# Patient Record
Sex: Female | Born: 1960 | Hispanic: No | Marital: Married | Smoking: Never smoker
Health system: Southern US, Community
[De-identification: ages and names within clinical notes are randomized; demographics above are authoritative.]

## PROBLEM LIST (undated history)

## (undated) DIAGNOSIS — F329 Major depressive disorder, single episode, unspecified: Secondary | ICD-10-CM

## (undated) DIAGNOSIS — D649 Anemia, unspecified: Secondary | ICD-10-CM

## (undated) DIAGNOSIS — T7840XA Allergy, unspecified, initial encounter: Secondary | ICD-10-CM

## (undated) DIAGNOSIS — I10 Essential (primary) hypertension: Secondary | ICD-10-CM

## (undated) DIAGNOSIS — R011 Cardiac murmur, unspecified: Secondary | ICD-10-CM

## (undated) DIAGNOSIS — F4310 Post-traumatic stress disorder, unspecified: Secondary | ICD-10-CM

## (undated) DIAGNOSIS — E7800 Pure hypercholesterolemia, unspecified: Secondary | ICD-10-CM

## (undated) DIAGNOSIS — F419 Anxiety disorder, unspecified: Secondary | ICD-10-CM

## (undated) DIAGNOSIS — R7303 Prediabetes: Secondary | ICD-10-CM

## (undated) DIAGNOSIS — M1990 Unspecified osteoarthritis, unspecified site: Secondary | ICD-10-CM

## (undated) DIAGNOSIS — M797 Fibromyalgia: Secondary | ICD-10-CM

## (undated) DIAGNOSIS — G9332 Myalgic encephalomyelitis/chronic fatigue syndrome: Secondary | ICD-10-CM

## (undated) DIAGNOSIS — G629 Polyneuropathy, unspecified: Secondary | ICD-10-CM

## (undated) DIAGNOSIS — Z8619 Personal history of other infectious and parasitic diseases: Secondary | ICD-10-CM

## (undated) DIAGNOSIS — G2581 Restless legs syndrome: Secondary | ICD-10-CM

## (undated) DIAGNOSIS — R5382 Chronic fatigue, unspecified: Secondary | ICD-10-CM

## (undated) DIAGNOSIS — F32A Depression, unspecified: Secondary | ICD-10-CM

## (undated) HISTORY — DX: Myalgic encephalomyelitis/chronic fatigue syndrome: G93.32

## (undated) HISTORY — PX: APPENDECTOMY: SHX54

## (undated) HISTORY — DX: Pure hypercholesterolemia, unspecified: E78.00

## (undated) HISTORY — PX: DILATION AND CURETTAGE OF UTERUS: SHX78

## (undated) HISTORY — DX: Major depressive disorder, single episode, unspecified: F32.9

## (undated) HISTORY — PX: ROTATOR CUFF REPAIR: SHX139

## (undated) HISTORY — DX: Fibromyalgia: M79.7

## (undated) HISTORY — DX: Essential (primary) hypertension: I10

## (undated) HISTORY — DX: Polyneuropathy, unspecified: G62.9

## (undated) HISTORY — DX: Chronic fatigue, unspecified: R53.82

## (undated) HISTORY — DX: Depression, unspecified: F32.A

## (undated) HISTORY — DX: Allergy, unspecified, initial encounter: T78.40XA

## (undated) HISTORY — DX: Unspecified osteoarthritis, unspecified site: M19.90

## (undated) HISTORY — DX: Anxiety disorder, unspecified: F41.9

## (undated) HISTORY — DX: Prediabetes: R73.03

## (undated) HISTORY — DX: Personal history of other infectious and parasitic diseases: Z86.19

## (undated) HISTORY — DX: Post-traumatic stress disorder, unspecified: F43.10

## (undated) HISTORY — DX: Anemia, unspecified: D64.9

## (undated) HISTORY — DX: Restless legs syndrome: G25.81

## (undated) HISTORY — DX: Cardiac murmur, unspecified: R01.1

---

## 2012-07-06 HISTORY — PX: CERVICAL DISC SURGERY: SHX588

## 2012-07-06 HISTORY — PX: ABDOMINAL HYSTERECTOMY: SHX81

## 2015-01-17 ENCOUNTER — Ambulatory Visit (INDEPENDENT_AMBULATORY_CARE_PROVIDER_SITE_OTHER): Payer: 59 | Admitting: Family Medicine

## 2015-01-17 ENCOUNTER — Encounter: Payer: Self-pay | Admitting: Family Medicine

## 2015-01-17 ENCOUNTER — Encounter (INDEPENDENT_AMBULATORY_CARE_PROVIDER_SITE_OTHER): Payer: Self-pay

## 2015-01-17 VITALS — BP 126/88 | HR 75 | Temp 98.3°F | Resp 16 | Ht 63.0 in | Wt 193.2 lb

## 2015-01-17 DIAGNOSIS — M4322 Fusion of spine, cervical region: Secondary | ICD-10-CM | POA: Insufficient documentation

## 2015-01-17 DIAGNOSIS — F3341 Major depressive disorder, recurrent, in partial remission: Secondary | ICD-10-CM | POA: Insufficient documentation

## 2015-01-17 DIAGNOSIS — M7581 Other shoulder lesions, right shoulder: Secondary | ICD-10-CM | POA: Insufficient documentation

## 2015-01-17 DIAGNOSIS — I10 Essential (primary) hypertension: Secondary | ICD-10-CM

## 2015-01-17 DIAGNOSIS — G2581 Restless legs syndrome: Secondary | ICD-10-CM | POA: Insufficient documentation

## 2015-01-17 DIAGNOSIS — G8929 Other chronic pain: Secondary | ICD-10-CM | POA: Insufficient documentation

## 2015-01-17 DIAGNOSIS — R52 Pain, unspecified: Secondary | ICD-10-CM | POA: Diagnosis not present

## 2015-01-17 DIAGNOSIS — M797 Fibromyalgia: Secondary | ICD-10-CM | POA: Diagnosis not present

## 2015-01-17 DIAGNOSIS — Z13220 Encounter for screening for lipoid disorders: Secondary | ICD-10-CM

## 2015-01-17 DIAGNOSIS — F418 Other specified anxiety disorders: Secondary | ICD-10-CM

## 2015-01-17 DIAGNOSIS — G4701 Insomnia due to medical condition: Secondary | ICD-10-CM | POA: Insufficient documentation

## 2015-01-17 DIAGNOSIS — R5383 Other fatigue: Secondary | ICD-10-CM | POA: Insufficient documentation

## 2015-01-17 DIAGNOSIS — B0229 Other postherpetic nervous system involvement: Secondary | ICD-10-CM | POA: Insufficient documentation

## 2015-01-17 HISTORY — DX: Essential (primary) hypertension: I10

## 2015-01-17 MED ORDER — MILNACIPRAN HCL 12.5 MG PO TABS: 1.0000 | ORAL_TABLET | Freq: Two times a day (BID) | ORAL | Status: DC

## 2015-01-17 NOTE — Progress Notes (Signed)
Name: Yvette Garza   MRN: [id number]    DOB: 27-Jul-1960   Date:01/17/2015       Progress Note  Subjective  Chief Complaint  Chief Complaint  Patient presents with  . Establish Care    HPI  Joint/Muscle Pain: Patient complains of arthralgias and myalgias for which has been present for several years. Pain is located in neck, shoulders, arms, is described as aching, and is constant .  Associated symptoms include: decreased range of motion.  The patient has tried cold, heat, NSAIDs, position change, rest and gabapentin for pain, with minimal relief.  Long standing issue with Cervical Spine pain with recent anterior approach fusion surgery done Oct-Nov 2014 in [place] where she is relocating from today. She is concerned about ongoing restrictions in ROM of her neck and ongoing pain and weakness in her hands. Overall her fibromyalgia is not well controled. Depression well controled and insomnia well controled on current medication.  Hypertension: Patient is here for evaluation of elevated blood pressures.  Age at onset of elevated blood pressure:  Several years ago. Cardiac symptoms fatigue. Patient denies chest pain, chest pressure/discomfort, claudication, exertional chest pressure/discomfort, irregular heart beat, lower extremity edema, near-syncope, palpitations, syncope and tachypnea.  Cardiovascular risk factors: hypertension, obesity (BMI >= 30 kg/m2) and sedentary lifestyle. Use of agents associated with hypertension: NSAIDS. History of target organ damage: none.    Patient Active Problem List   Diagnosis Date Noted  . Fibromyalgia 01/17/2015  . Depression with anxiety 01/17/2015  . Insomnia due to medical condition 01/17/2015  . History of hypertension 01/17/2015  . Chronic pain of multiple sites 01/17/2015    History  Substance Use Topics  . Smoking status: Never Smoker   . Smokeless tobacco: Not on file  . Alcohol Use: 0.0 oz/week    0 Standard drinks or equivalent  per week     Comment: socially     Current outpatient prescriptions:  .  buPROPion (WELLBUTRIN XL) 300 MG 24 hr tablet, TK 1 T PO QD, Disp: , Rfl: 0 .  lisinopril (PRINIVIL,ZESTRIL) 5 MG tablet, Take 5 mg by mouth daily., Disp: , Rfl:  .  naproxen (NAPROSYN) 500 MG tablet, Take 500 mg by mouth 2 (two) times daily with a meal., Disp: , Rfl:  .  traZODone (DESYREL) 100 MG tablet, TK 1 T PO HS, Disp: , Rfl: 1  Past Surgical History  Procedure Laterality Date  . Abdominal hysterectomy  2014  . Cervical disc surgery  2014  . Appendectomy      age 24  . Dilation and curettage of uterus    . Cesarean section      3  . Rotator cuff repair Right     bone shaved also    Family History  Problem Relation Age of Onset  . Hypertension Mother   . Asthma Sister   . Emphysema Sister   . Diabetes Maternal Aunt   . Hyperlipidemia Maternal Aunt   . Arthritis Maternal Grandmother   . Diabetes Maternal Grandmother   . Cataracts Maternal Grandmother   . Heart disease Maternal Grandmother     aortic tear  . Stroke Maternal Grandmother     No Known Allergies   Review of Systems  CONSTITUTIONAL: No significant weight changes, fever, chills, weakness or fatigue.  HEENT:  - Eyes: No visual changes.  - Ears: No auditory changes. No pain.  - Nose: No sneezing, congestion, runny nose. - Throat: No sore throat. No changes in swallowing.  SKIN: No rash or itching.  CARDIOVASCULAR: No chest pain, chest pressure or chest discomfort. No palpitations or edema.  RESPIRATORY: No shortness of breath, cough or sputum.  GASTROINTESTINAL: No anorexia, nausea, vomiting. No changes in bowel habits. No abdominal pain or blood.  GENITOURINARY: No dysuria. No frequency. No discharge.  NEUROLOGICAL: No headache, dizziness, syncope, paralysis, ataxia, numbness or tingling in the extremities. No memory changes. No change in bowel or bladder control.  MUSCULOSKELETAL: Chronic joint and muscle pain. HEMATOLOGIC:  No anemia, bleeding or bruising.  LYMPHATICS: No enlarged lymph nodes.  PSYCHIATRIC: No change in mood. No change in sleep pattern.  ENDOCRINOLOGIC: No reports of sweating, cold or heat intolerance. No polyuria or polydipsia.    Objective  BP 126/88 mmHg  Pulse 75  Temp(Src) 98.3 F (36.8 C) (Oral)  Resp 16  Ht 5\' 3"  (1.6 m)  Wt 193 lb 3.2 oz (87.635 kg)  BMI 34.23 kg/m2  SpO2 98% Body mass index is 34.23 kg/(m^2).  Physical Exam  Constitutional: Patient is obese and well-nourished. In no distress.  HEENT:  - Head: Normocephalic and atraumatic.  - Ears: Bilateral TMs gray, no erythema or effusion - Nose: Nasal mucosa moist - Mouth/Throat: Oropharynx is clear and moist. No tonsillar hypertrophy or erythema. No post nasal drainage.  - Eyes: Conjunctivae clear, EOM movements normal. PERRLA. No scleral icterus.  Neck: Normal range of motion. Neck supple. No JVD present. No thyromegaly present.  Cardiovascular: Normal rate, regular rhythm and normal heart sounds.  No murmur heard.  Pulmonary/Chest: Effort normal and breath sounds normal. No respiratory distress. Musculoskeletal: C-spine no palpable step off or point tenderness, ROM restricted side to side movement about 50degrees, pain worse with chin to chest. Bilateral UE strength 4/5 at shoulder joints and hand grip. Sensation intact.  Peripheral vascular: Bilateral LE no edema. Neurological: CN II-XII grossly intact with no focal deficits. Alert and oriented to person, place, and time. Coordination, balance, strength, speech and gait are normal.  Skin: Skin is warm and dry. No rash noted. No erythema.  Psychiatric: Patient has a normal mood and affect. Behavior is normal in office today. Judgment and thought content normal in office today.   Assessment & Plan   1. Fibromyalgia Trial of [place]ella. If not approved by insurance consider restarting Gabapentin at night time only (otherwise it caused her to be be very drowsy during  the day)  - Milnacipran HCl 12.5 MG TABS; Take 1 tablet (12.5 mg total) by mouth 2 (two) times daily.  Dispense: 60 tablet; Refill: 2  2. Depression with anxiety Stable on Wellbutrin alone since 2005.  3. Insomnia due to medical condition Reasonably controled on Trazodone.   4. Chronic pain of multiple sites Awaiting previous records to review from [place].  - Sedimentation rate  5. Fusion of spine of cervical region October-November of 2014 had C-spine fusion and disc cadaver replacement. Still having restricted ROM side to side movements with some neck pain radiating down trapezius to shoulders and arm, right worse than left. She would like to consult with Neurosurgery group.  - Ambulatory referral to Neurosurgery  6. Hypertension goal BP (blood pressure) < 150/90 Reasonable today off of lisinopril 5mg   - CBC with Differential/Platelet - Comprehensive metabolic panel - TSH - Hemoglobin A1c  7. Screening cholesterol level - Lipid panel

## 2015-01-22 ENCOUNTER — Telehealth: Payer: Self-pay | Admitting: Family Medicine

## 2015-01-22 NOTE — Telephone Encounter (Signed)
Patient states that [facility] told her that Milnacipran is needing authorization. Please call patient once this has been taken care of

## 2015-01-22 NOTE — Telephone Encounter (Signed)
Called patient and notified her that I have sent in her Prior Authorization and will have to wait for the insurance company to approve or deny and then will let her know, pt verbalized understanding.

## 2015-02-05 ENCOUNTER — Telehealth: Payer: Self-pay

## 2015-02-05 NOTE — Telephone Encounter (Signed)
Pt called wanting to know status of medication needing a PA. Her pharmacy told her that they had faxed over needed paperwork for doctor to sign.

## 2015-02-06 ENCOUNTER — Other Ambulatory Visit: Payer: Self-pay | Admitting: Family Medicine

## 2015-02-06 ENCOUNTER — Other Ambulatory Visit: Payer: Self-pay

## 2015-02-06 DIAGNOSIS — F418 Other specified anxiety disorders: Secondary | ICD-10-CM

## 2015-02-06 DIAGNOSIS — M797 Fibromyalgia: Secondary | ICD-10-CM

## 2015-02-06 DIAGNOSIS — G4700 Insomnia, unspecified: Secondary | ICD-10-CM

## 2015-02-06 DIAGNOSIS — B0229 Other postherpetic nervous system involvement: Secondary | ICD-10-CM

## 2015-02-06 MED ORDER — DULOXETINE HCL 30 MG PO CPEP
30.0000 mg | ORAL_CAPSULE | Freq: Every day | ORAL | Status: DC
Start: 2015-02-06 — End: 2015-02-19

## 2015-02-06 MED ORDER — BUPROPION HCL ER (XL) 300 MG PO TB24: 300.0000 mg | ORAL_TABLET | Freq: Every day | ORAL | Status: DC

## 2015-02-06 MED ORDER — TRAZODONE HCL 100 MG PO TABS: 100.0000 mg | ORAL_TABLET | Freq: Every evening | ORAL | Status: DC | PRN

## 2015-02-06 NOTE — Telephone Encounter (Signed)
Please let patient know that Insurance denied coverage for [facility] despite prior authorization completed because they want her to try one of the following: Amitriptyline, duloxetine. I can sent a prescription for one of those if she is agreeable, let me know.

## 2015-02-06 NOTE — Telephone Encounter (Signed)
Tried to contact this patient to review the message below, but there was no answer. A message was left for her explaining that a medication was sent in for her that should cover all of her sx and if she had any questions to give [place] a call.

## 2015-02-06 NOTE — Telephone Encounter (Signed)
Patient stated that she will try whichever one Dr. Nadine Counts thinks will work best. Please send to [place]. [employer].

## 2015-02-06 NOTE — Telephone Encounter (Signed)
Please let patient know that I sent in Cymbalta (generic she will get is duloxetine) 30mg  one a day. It comes in 20mg , 30mg  and 60mg  so I started her in the middle at 30mg . This medication has had good results for fibromyalgia, pain, depression and anxiety so try and stick to it as it may take 1-2 months to be effective and we can adjust dose as needed.

## 2015-02-06 NOTE — Telephone Encounter (Signed)
Received a fax from [facility] requesting a refill for both of these medications.  Refill request was sent to Dr. Bobetta Lime for approval and submission.

## 2015-02-07 ENCOUNTER — Other Ambulatory Visit: Payer: Self-pay | Admitting: Neurology

## 2015-02-07 DIAGNOSIS — R413 Other amnesia: Secondary | ICD-10-CM

## 2015-02-07 DIAGNOSIS — R479 Unspecified speech disturbances: Secondary | ICD-10-CM

## 2015-02-07 DIAGNOSIS — R42 Dizziness and giddiness: Secondary | ICD-10-CM

## 2015-02-07 LAB — CBC WITH DIFFERENTIAL/PLATELET
Basophils Absolute: 0 10*3/uL (ref 0.0–0.2)
Basos: 0 %
EOS (ABSOLUTE): 0.1 10*3/uL (ref 0.0–0.4)
Eos: 2 %
HEMATOCRIT: 35.2 % (ref 34.0–46.6)
HEMOGLOBIN: 11.2 g/dL (ref 11.1–15.9)
IMMATURE GRANULOCYTES: 0 %
Immature Grans (Abs): 0 10*3/uL (ref 0.0–0.1)
LYMPHS: 33 %
Lymphocytes Absolute: 1.5 10*3/uL (ref 0.7–3.1)
MCH: 25.5 pg — ABNORMAL LOW (ref 26.6–33.0)
MCHC: 31.8 g/dL (ref 31.5–35.7)
MCV: 80 fL (ref 79–97)
Monocytes Absolute: 0.3 10*3/uL (ref 0.1–0.9)
Monocytes: 7 %
NEUTROS PCT: 58 %
Neutrophils Absolute: 2.6 10*3/uL (ref 1.4–7.0)
Platelets: 182 10*3/uL (ref 150–379)
RBC: 4.4 x10E6/uL (ref 3.77–5.28)
RDW: 16.9 % — ABNORMAL HIGH (ref 12.3–15.4)
WBC: 4.5 10*3/uL (ref 3.4–10.8)

## 2015-02-07 LAB — COMPREHENSIVE METABOLIC PANEL
A/G RATIO: 1.8 (ref 1.1–2.5)
ALK PHOS: 57 IU/L (ref 39–117)
ALT: 10 IU/L (ref 0–32)
AST: 12 IU/L (ref 0–40)
Albumin: 4.1 g/dL (ref 3.5–5.5)
BUN/Creatinine Ratio: 21 (ref 9–23)
BUN: 17 mg/dL (ref 6–24)
Bilirubin Total: 0.4 mg/dL (ref 0.0–1.2)
CHLORIDE: 103 mmol/L (ref 97–108)
CO2: 24 mmol/L (ref 18–29)
Calcium: 9.2 mg/dL (ref 8.7–10.2)
Creatinine, Ser: 0.81 mg/dL (ref 0.57–1.00)
GFR, EST AFRICAN AMERICAN: 97 mL/min/{1.73_m2} (ref >59)
GFR, EST NON AFRICAN AMERICAN: 84 mL/min/{1.73_m2} (ref >59)
GLOBULIN, TOTAL: 2.3 g/dL (ref 1.5–4.5)
GLUCOSE: 96 mg/dL (ref 65–99)
POTASSIUM: 4.4 mmol/L (ref 3.5–5.2)
Sodium: 142 mmol/L (ref 134–144)
Total Protein: 6.4 g/dL (ref 6.0–8.5)

## 2015-02-07 LAB — LIPID PANEL
CHOLESTEROL TOTAL: 201 mg/dL — AB (ref 100–199)
Chol/HDL Ratio: 4.8 ratio units — ABNORMAL HIGH (ref 0.0–4.4)
HDL: 42 mg/dL (ref >39)
LDL CALC: 115 mg/dL — AB (ref 0–99)
TRIGLYCERIDES: 218 mg/dL — AB (ref 0–149)
VLDL Cholesterol Cal: 44 mg/dL — ABNORMAL HIGH (ref 5–40)

## 2015-02-07 LAB — TSH: TSH: 3.22 u[IU]/mL (ref 0.450–4.500)

## 2015-02-07 LAB — HEMOGLOBIN A1C
ESTIMATED AVERAGE GLUCOSE: 123 mg/dL
HEMOGLOBIN A1C: 5.9 % — AB (ref 4.8–5.6)

## 2015-02-07 LAB — SEDIMENTATION RATE: SED RATE: 7 mm/h (ref 0–40)

## 2015-02-08 NOTE — Progress Notes (Signed)
No anemia, but low normal results with microcytic RBCs. Will review all results during upcoming CPE.

## 2015-02-11 ENCOUNTER — Ambulatory Visit: Payer: Commercial Managed Care - HMO

## 2015-02-12 ENCOUNTER — Encounter: Payer: Self-pay | Admitting: Family Medicine

## 2015-02-12 ENCOUNTER — Ambulatory Visit (INDEPENDENT_AMBULATORY_CARE_PROVIDER_SITE_OTHER): Payer: 59 | Admitting: Family Medicine

## 2015-02-12 VITALS — BP 124/78 | HR 97 | Temp 98.2°F | Resp 18 | Wt 193.4 lb

## 2015-02-12 DIAGNOSIS — G43009 Migraine without aura, not intractable, without status migrainosus: Secondary | ICD-10-CM | POA: Insufficient documentation

## 2015-02-12 DIAGNOSIS — M797 Fibromyalgia: Secondary | ICD-10-CM

## 2015-02-12 DIAGNOSIS — Z1239 Encounter for other screening for malignant neoplasm of breast: Secondary | ICD-10-CM | POA: Insufficient documentation

## 2015-02-12 DIAGNOSIS — Z0000 Encounter for general adult medical examination without abnormal findings: Secondary | ICD-10-CM | POA: Diagnosis not present

## 2015-02-12 DIAGNOSIS — Z124 Encounter for screening for malignant neoplasm of cervix: Secondary | ICD-10-CM

## 2015-02-12 DIAGNOSIS — Z1211 Encounter for screening for malignant neoplasm of colon: Secondary | ICD-10-CM | POA: Diagnosis not present

## 2015-02-12 NOTE — Progress Notes (Signed)
Name: Yvette Garza   MRN: [id number]    DOB: 03-10-1961   Date:02/12/2015       Progress Note  Subjective  Chief Complaint  Chief Complaint  Patient presents with  . Annual Exam    HPI  Patient is here today for a Complete Female Physical Exam:  The patient has has no unusual complaints. Overall feels healthy. Diet is well balanced. In general does not exercise regularly. Sees dentist regularly and addresses vision concerns with ophthalmologist if applicable. In regards to sexual activity the patient is currently sexually active. Currently is not concerned about exposure to any STDs. Would like Pelvic exam as she sometimes have left ovarian pain but no vaginal bleeding or discharge. She also had blood work done recently and would like to review results.  Menstrual history: s/p partial hysterectomy.    Past Medical History  Diagnosis Date  . Allergy   . Anemia   . Anxiety   . Arthritis   . Depression   . Heart murmur   . Hyperlipidemia     history of  . Hypertension     history of  . Post traumatic stress disorder (PTSD)   . History of shingles   . Restless leg syndrome   . Fibromyalgia   . Chronic fatigue syndrome   . Neuropathy     post    Past Surgical History  Procedure Laterality Date  . Abdominal hysterectomy  2014  . Cervical disc surgery  2014  . Appendectomy      age 3  . Dilation and curettage of uterus    . Cesarean section      3  . Rotator cuff repair Right     bone shaved also    Family History  Problem Relation Age of Onset  . Hypertension Mother   . Asthma Sister   . Emphysema Sister   . Diabetes Maternal Aunt   . Hyperlipidemia Maternal Aunt   . Arthritis Maternal Grandmother   . Diabetes Maternal Grandmother   . Cataracts Maternal Grandmother   . Heart disease Maternal Grandmother     aortic tear  . Stroke Maternal Grandmother     History   Social History  . Marital Status: Married    Spouse Name: N/A  . Number of Children: 4   . Years of Education: N/A   Occupational History  . Not on file.   Social History Main Topics  . Smoking status: Never Smoker   . Smokeless tobacco: Not on file  . Alcohol Use: 0.0 oz/week    0 Standard drinks or equivalent per week     Comment: socially  . Drug Use: No  . Sexual Activity:    Partners: Male    Birth Control/ Protection: None   Other Topics Concern  . Not on file   Social History Narrative     Current outpatient prescriptions:  .  buPROPion (WELLBUTRIN XL) 300 MG 24 hr tablet, Take 1 tablet (300 mg total) by mouth daily., Disp: 30 tablet, Rfl: 5 .  DULoxetine (CYMBALTA) 30 MG capsule, Take 1 capsule (30 mg total) by mouth daily., Disp: 30 capsule, Rfl: 3 .  LORazepam (ATIVAN) 1 MG tablet, , Disp: , Rfl:  .  naproxen (NAPROSYN) 500 MG tablet, Take by mouth., Disp: , Rfl:  .  rizatriptan (MAXALT) 10 MG tablet, , Disp: , Rfl:  .  traZODone (DESYREL) 100 MG tablet, Take 1 tablet (100 mg total) by mouth at bedtime as needed  for sleep., Disp: 30 tablet, Rfl: 5  No Known Allergies  ROS  CONSTITUTIONAL: No significant weight changes, fever, chills, weakness or fatigue.  HEENT:  - Eyes: No visual changes.  - Ears: No auditory changes. No pain.  - Nose: No sneezing, congestion, runny nose. - Throat: No sore throat. No changes in swallowing. SKIN: No rash or itching.  CARDIOVASCULAR: No chest pain, chest pressure or chest discomfort. No palpitations or edema.  RESPIRATORY: No shortness of breath, cough or sputum.  GASTROINTESTINAL: No anorexia, nausea, vomiting. No changes in bowel habits. Occasional LUQ and LLQ abdominal pain. GENITOURINARY: No dysuria. No frequency. No discharge.  NEUROLOGICAL: No headache, dizziness, syncope, paralysis, ataxia, numbness or tingling in the extremities. No memory changes. No change in bowel or bladder control.  MUSCULOSKELETAL: Chronic widespread muscle and joint pain without stiffness. HEMATOLOGIC: No anemia, bleeding or  bruising.  LYMPHATICS: No enlarged lymph nodes.  PSYCHIATRIC: No change in mood. No change in sleep pattern.  ENDOCRINOLOGIC: No reports of sweating, cold or heat intolerance. No polyuria or polydipsia.   Objective  Filed Vitals:   02/12/15 1138  BP: 124/78  Pulse: 97  Temp: 98.2 F (36.8 C)  TempSrc: Oral  Resp: 18  Weight: 193 lb 6.4 oz (87.726 kg)  SpO2: 98%   Body mass index is 34.27 kg/(m^2).    Recent Results (from the past 2160 hour(s))  CBC with Differential/Platelet     Status: Abnormal   Collection Time: 02/06/15  9:55 AM  Result Value Ref Range   WBC 4.5 3.4 - 10.8 x10E3/uL   RBC 4.40 3.77 - 5.28 x10E6/uL   Hemoglobin 11.2 11.1 - 15.9 g/dL   Hematocrit 35.2 34.0 - 46.6 %   MCV 80 79 - 97 fL   MCH 25.5 (L) 26.6 - 33.0 pg   MCHC 31.8 31.5 - 35.7 g/dL   RDW 16.9 (H) 12.3 - 15.4 %   Platelets 182 150 - 379 x10E3/uL   Neutrophils 58 %   Lymphs 33 %   Monocytes 7 %   Eos 2 %   Basos 0 %   Neutrophils Absolute 2.6 1.4 - 7.0 x10E3/uL   Lymphocytes Absolute 1.5 0.7 - 3.1 x10E3/uL   Monocytes Absolute 0.3 0.1 - 0.9 x10E3/uL   EOS (ABSOLUTE) 0.1 0.0 - 0.4 x10E3/uL   Basophils Absolute 0.0 0.0 - 0.2 x10E3/uL   Immature Granulocytes 0 %   Immature Grans (Abs) 0.0 0.0 - 0.1 x10E3/uL  Comprehensive metabolic panel     Status: None   Collection Time: 02/06/15  9:55 AM  Result Value Ref Range   Glucose 96 65 - 99 mg/dL   BUN 17 6 - 24 mg/dL   Creatinine, Ser 0.81 0.57 - 1.00 mg/dL   GFR calc non Af Amer 84 >59 mL/min/1.73   GFR calc Af Amer 97 >59 mL/min/1.73   BUN/Creatinine Ratio 21 9 - 23   Sodium 142 134 - 144 mmol/L   Potassium 4.4 3.5 - 5.2 mmol/L   Chloride 103 97 - 108 mmol/L   CO2 24 18 - 29 mmol/L   Calcium 9.2 8.7 - 10.2 mg/dL   Total Protein 6.4 6.0 - 8.5 g/dL   Albumin 4.1 3.5 - 5.5 g/dL   Globulin, Total 2.3 1.5 - 4.5 g/dL   Albumin/Globulin Ratio 1.8 1.1 - 2.5   Bilirubin Total 0.4 0.0 - 1.2 mg/dL   Alkaline Phosphatase 57 39 - 117 IU/L    AST 12 0 - 40 IU/L   ALT  10 0 - 32 IU/L  Lipid panel     Status: Abnormal   Collection Time: 02/06/15  9:55 AM  Result Value Ref Range   Cholesterol, Total 201 (H) 100 - 199 mg/dL   Triglycerides 218 (H) 0 - 149 mg/dL   HDL 42 >39 mg/dL    Comment: According to ATP-III Guidelines, HDL-C >59 mg/dL is considered a negative risk factor for CHD.    VLDL Cholesterol Cal 44 (H) 5 - 40 mg/dL   LDL Calculated 115 (H) 0 - 99 mg/dL   Chol/HDL Ratio 4.8 (H) 0.0 - 4.4 ratio units    Comment:                                   T. Chol/HDL Ratio                                             Men  Women                               1/2 Avg.Risk  3.4    3.3                                   Avg.Risk  5.0    4.4                                2X Avg.Risk  9.6    7.1                                3X Avg.Risk 23.4   11.0   TSH     Status: None   Collection Time: 02/06/15  9:55 AM  Result Value Ref Range   TSH 3.220 0.450 - 4.500 uIU/mL  Sedimentation rate     Status: None   Collection Time: 02/06/15  9:55 AM  Result Value Ref Range   Sed Rate 7 0 - 40 mm/hr  Hemoglobin A1c     Status: Abnormal   Collection Time: 02/06/15  9:55 AM  Result Value Ref Range   Hgb A1c MFr Bld 5.9 (H) 4.8 - 5.6 %    Comment:          Pre-diabetes: 5.7 - 6.4          Diabetes: >6.4          Glycemic control for adults with diabetes: <7.0    Est. average glucose Bld gHb Est-mCnc 123 mg/dL    Physical Exam  Constitutional: Patient appears well-developed and well-nourished. In no distress.  HEENT:  - Head: Normocephalic and atraumatic.  - Ears: Bilateral TMs gray, no erythema or effusion - Nose: Nasal mucosa moist - Mouth/Throat: Oropharynx is clear and moist. No tonsillar hypertrophy or erythema. No post nasal drainage.  - Eyes: Conjunctivae clear, EOM movements normal. PERRLA. No scleral icterus.  Neck: Normal range of motion. Neck supple. No JVD present. No thyromegaly present.  Cardiovascular: Normal rate,  regular rhythm and normal heart sounds.  No murmur heard.  Pulmonary/Chest: Effort normal and breath sounds normal.  No respiratory distress. Abdominal: Soft. Bowel sounds are normal, no distension. There is no tenderness. no masses BREAST: Bilateral breast exam normal with no masses, skin changes or nipple discharge FEMALE GENITALIA:  External genitalia normal External urethra normal Vaginal vault normal without discharge or lesions Cervix remnants normal without discharge or lesions Bimanual exam normal without masses, surgically absent uterus RECTAL: no rectal masses or hemorrhoids Musculoskeletal: Normal range of motion bilateral UE and LE, no joint effusions. Tenderness noted at 12 paired points out of the 18 recommended for Fibromyalgia testing:  Peripheral vascular: Bilateral LE no edema. Neurological: CN II-XII grossly intact with no focal deficits. Alert and oriented to person, place, and time. Coordination, balance, strength, speech and gait are normal.  Skin: Skin is warm and dry. No rash noted. No erythema.  Psychiatric: Patient has a stable mood and affect. Behavior is normal in office today. Judgment and thought content normal in office today.   Assessment & Plan  1. Annual physical exam   2. Breast cancer screening  - MM Digital Screening; Future  3. Colon cancer screening  - Ambulatory referral to General Surgery  4. Migraine without aura and without status migrainosus, not intractable Working with Dr. Manuella Ghazi regarding new in office minimally invasive procedure, nasal/sinus lidocaine spray.  5. Fibromyalgia Unable to get insurance to authorize [place]. But they would cover Cymbalta which she has picked up but not started as she wants to finish her 2 month therapy for her headaches with Dr. Manuella Ghazi first.  6. Cervical cancer screening  - Pap IG w/ reflex to HPV when ASC-U

## 2015-02-14 ENCOUNTER — Ambulatory Visit: Payer: Commercial Managed Care - HMO

## 2015-02-15 LAB — PAP IG W/ RFLX HPV ASCU: PAP SMEAR COMMENT: 0

## 2015-02-18 ENCOUNTER — Other Ambulatory Visit: Payer: Self-pay | Admitting: Family Medicine

## 2015-02-18 DIAGNOSIS — G4700 Insomnia, unspecified: Secondary | ICD-10-CM

## 2015-02-18 DIAGNOSIS — F418 Other specified anxiety disorders: Secondary | ICD-10-CM

## 2015-02-18 MED ORDER — TRAZODONE HCL 100 MG PO TABS: 100.0000 mg | ORAL_TABLET | Freq: Every evening | ORAL | Status: DC | PRN

## 2015-02-18 MED ORDER — BUPROPION HCL ER (XL) 300 MG PO TB24: 300.0000 mg | ORAL_TABLET | Freq: Every day | ORAL | Status: DC

## 2015-02-18 NOTE — Telephone Encounter (Signed)
Pt needs refill on Trazodone and Wellbutrin to be called into [facility] in [place]. Totally out of trazodone.

## 2015-02-18 NOTE — Telephone Encounter (Signed)
Refill request was sent to Dr. Ashany Sundaram for approval and submission.  

## 2015-02-19 ENCOUNTER — Other Ambulatory Visit: Payer: Self-pay

## 2015-02-19 DIAGNOSIS — B0229 Other postherpetic nervous system involvement: Secondary | ICD-10-CM

## 2015-02-19 DIAGNOSIS — F418 Other specified anxiety disorders: Secondary | ICD-10-CM

## 2015-02-19 DIAGNOSIS — M797 Fibromyalgia: Secondary | ICD-10-CM

## 2015-02-19 MED ORDER — DULOXETINE HCL 30 MG PO CPEP: 30.0000 mg | ORAL_CAPSULE | Freq: Every day | ORAL | Status: DC

## 2015-02-19 NOTE — Telephone Encounter (Signed)
Got a fax from [facility] requesting this medication to be sent in.  Refill request was sent to Dr. Bobetta Lime for approval and submission.

## 2015-02-20 ENCOUNTER — Other Ambulatory Visit: Payer: Self-pay

## 2015-02-20 DIAGNOSIS — B0229 Other postherpetic nervous system involvement: Secondary | ICD-10-CM

## 2015-02-20 DIAGNOSIS — M797 Fibromyalgia: Secondary | ICD-10-CM

## 2015-02-20 DIAGNOSIS — F418 Other specified anxiety disorders: Secondary | ICD-10-CM

## 2015-02-20 NOTE — Telephone Encounter (Signed)
Got a fax from [facility] requesting a new rx be sent to them for Duloxetine.  Refill request was sent to Dr. Bobetta Lime for approval and submission.

## 2015-02-21 ENCOUNTER — Other Ambulatory Visit: Payer: Self-pay

## 2015-02-21 DIAGNOSIS — G4700 Insomnia, unspecified: Secondary | ICD-10-CM

## 2015-02-21 MED ORDER — TRAZODONE HCL 100 MG PO TABS: 100.0000 mg | ORAL_TABLET | Freq: Every evening | ORAL | Status: DC | PRN

## 2015-02-21 MED ORDER — DULOXETINE HCL 30 MG PO CPEP: 30.0000 mg | ORAL_CAPSULE | Freq: Every day | ORAL | Status: DC

## 2015-02-21 NOTE — Telephone Encounter (Signed)
Received a fax from [facility] requesting a refill of Trazadone.  Refill request was sent to Dr. Bobetta Lime for approval and submission.

## 2015-02-25 ENCOUNTER — Other Ambulatory Visit: Payer: Self-pay

## 2015-02-25 ENCOUNTER — Telehealth: Payer: Self-pay | Admitting: Gastroenterology

## 2015-02-25 DIAGNOSIS — G4700 Insomnia, unspecified: Secondary | ICD-10-CM

## 2015-02-25 NOTE — Telephone Encounter (Signed)
Got a fax from [facility] requesting a refill of Trazadone.  Refill request was sent to Dr. Bobetta Lime for approval and submission.

## 2015-02-25 NOTE — Telephone Encounter (Signed)
triage

## 2015-02-26 MED ORDER — TRAZODONE HCL 100 MG PO TABS: 100.0000 mg | ORAL_TABLET | Freq: Every evening | ORAL | Status: DC | PRN

## 2015-02-28 ENCOUNTER — Telehealth: Payer: Self-pay | Admitting: Gastroenterology

## 2015-02-28 NOTE — Telephone Encounter (Signed)
Gastroenterology Pre-Procedure Review  Request Date: 03-14-2015 Requesting Physician: Dr. Nadine Counts  PATIENT REVIEW QUESTIONS: The patient responded to the following health history questions as indicated:    1. Are you having any GI issues? no 2. Do you have a personal history of Polyps? no 3. Do you have a family history of Colon Cancer or Polyps? no 4. Diabetes Mellitus? no 5. Joint replacements in the past 12 months?no 6. Major health problems in the past 3 months?no 7. Any artificial heart valves, MVP, or defibrillator?no    MEDICATIONS & ALLERGIES:    Patient reports the following regarding taking any anticoagulation/antiplatelet therapy:   Plavix, Coumadin, Eliquis, Xarelto, Lovenox, Pradaxa, Brilinta, or Effient? no Aspirin? no  Patient confirms/reports the following medications:  Current Outpatient Prescriptions  Medication Sig Dispense Refill   buPROPion (WELLBUTRIN XL) 300 MG 24 hr tablet Take 1 tablet (300 mg total) by mouth daily. 30 tablet 5   DULoxetine (CYMBALTA) 30 MG capsule Take 1 capsule (30 mg total) by mouth daily. 30 capsule 5   LORazepam (ATIVAN) 1 MG tablet      naproxen (NAPROSYN) 500 MG tablet Take by mouth.     rizatriptan (MAXALT) 10 MG tablet      traZODone (DESYREL) 100 MG tablet Take 1 tablet (100 mg total) by mouth at bedtime as needed for sleep. 90 tablet 1   No current facility-administered medications for this visit.    Patient confirms/reports the following allergies: NA No Known Allergies  No orders of the defined types were placed in this encounter.    AUTHORIZATION INFORMATION Primary Insurance: 1D#: Group #:  Secondary Insurance: 1D#: Group #:  SCHEDULE INFORMATION: Date:  Time: Location:

## 2015-02-28 NOTE — Telephone Encounter (Signed)
03-14-2015 Colonoscopy [facility] [employer]

## 2015-03-01 ENCOUNTER — Other Ambulatory Visit: Payer: Self-pay

## 2015-03-13 NOTE — Discharge Instructions (Signed)

## 2015-03-14 ENCOUNTER — Encounter
Admission: RE | Disposition: A | Discharge status: Home / Self Care | Payer: Self-pay | Source: Ambulatory Visit | Attending: Gastroenterology

## 2015-03-14 ENCOUNTER — Ambulatory Visit
Admission: RE | Admit: 2015-03-14 | Discharge: 2015-03-14 | Disposition: A | Discharge status: Home / Self Care | Payer: 59 | Source: Ambulatory Visit | Attending: Gastroenterology | Admitting: Gastroenterology

## 2015-03-14 ENCOUNTER — Other Ambulatory Visit: Payer: Self-pay | Admitting: Gastroenterology

## 2015-03-14 ENCOUNTER — Ambulatory Visit: Payer: 59 | Admitting: Anesthesiology

## 2015-03-14 DIAGNOSIS — D123 Benign neoplasm of transverse colon: Secondary | ICD-10-CM | POA: Insufficient documentation

## 2015-03-14 DIAGNOSIS — G2581 Restless legs syndrome: Secondary | ICD-10-CM | POA: Diagnosis not present

## 2015-03-14 DIAGNOSIS — D125 Benign neoplasm of sigmoid colon: Secondary | ICD-10-CM | POA: Diagnosis not present

## 2015-03-14 DIAGNOSIS — Z90710 Acquired absence of both cervix and uterus: Secondary | ICD-10-CM | POA: Diagnosis not present

## 2015-03-14 DIAGNOSIS — F329 Major depressive disorder, single episode, unspecified: Secondary | ICD-10-CM | POA: Diagnosis not present

## 2015-03-14 DIAGNOSIS — M797 Fibromyalgia: Secondary | ICD-10-CM | POA: Insufficient documentation

## 2015-03-14 DIAGNOSIS — E785 Hyperlipidemia, unspecified: Secondary | ICD-10-CM | POA: Insufficient documentation

## 2015-03-14 DIAGNOSIS — M1990 Unspecified osteoarthritis, unspecified site: Secondary | ICD-10-CM | POA: Insufficient documentation

## 2015-03-14 DIAGNOSIS — Z1211 Encounter for screening for malignant neoplasm of colon: Secondary | ICD-10-CM | POA: Insufficient documentation

## 2015-03-14 DIAGNOSIS — I10 Essential (primary) hypertension: Secondary | ICD-10-CM | POA: Insufficient documentation

## 2015-03-14 DIAGNOSIS — G5681 Other specified mononeuropathies of right upper limb: Secondary | ICD-10-CM | POA: Diagnosis not present

## 2015-03-14 DIAGNOSIS — F4310 Post-traumatic stress disorder, unspecified: Secondary | ICD-10-CM | POA: Insufficient documentation

## 2015-03-14 DIAGNOSIS — R011 Cardiac murmur, unspecified: Secondary | ICD-10-CM | POA: Insufficient documentation

## 2015-03-14 DIAGNOSIS — G5682 Other specified mononeuropathies of left upper limb: Secondary | ICD-10-CM | POA: Insufficient documentation

## 2015-03-14 DIAGNOSIS — F419 Anxiety disorder, unspecified: Secondary | ICD-10-CM | POA: Insufficient documentation

## 2015-03-14 HISTORY — PX: COLONOSCOPY WITH PROPOFOL: SHX5780

## 2015-03-14 HISTORY — PX: POLYPECTOMY: SHX5525

## 2015-03-14 SURGERY — COLONOSCOPY WITH PROPOFOL
Anesthesia: Monitor Anesthesia Care | Wound class: Contaminated

## 2015-03-14 MED ORDER — SODIUM CHLORIDE 0.9 % IV SOLN: INTRAVENOUS | Status: DC

## 2015-03-14 MED ORDER — LIDOCAINE HCL (CARDIAC) 20 MG/ML IV SOLN
INTRAVENOUS | Status: DC | PRN
  Administered 2015-03-14: 30 mg via INTRAVENOUS

## 2015-03-14 MED ORDER — OXYCODONE HCL 5 MG/5ML PO SOLN: 5.0000 mg | Freq: Once | ORAL | Status: DC | PRN

## 2015-03-14 MED ORDER — OXYCODONE HCL 5 MG PO TABS: 5.0000 mg | ORAL_TABLET | Freq: Once | ORAL | Status: DC | PRN

## 2015-03-14 MED ORDER — PROPOFOL 10 MG/ML IV BOLUS
INTRAVENOUS | Status: DC | PRN
  Administered 2015-03-14: 40 mg via INTRAVENOUS
  Administered 2015-03-14: 50 mg via INTRAVENOUS
  Administered 2015-03-14: 80 mg via INTRAVENOUS
  Administered 2015-03-14 (×3): 40 mg via INTRAVENOUS

## 2015-03-14 MED ORDER — LACTATED RINGERS IV SOLN
INTRAVENOUS | Status: DC
  Administered 2015-03-14 (×2): via INTRAVENOUS

## 2015-03-14 MED ORDER — STERILE WATER FOR IRRIGATION IR SOLN
Status: DC | PRN
  Administered 2015-03-14: 08:00:00

## 2015-03-14 SURGICAL SUPPLY — 28 items
CANISTER SUCT 1200ML W/VALVE (MISCELLANEOUS) ×4 IMPLANT
FCP ESCP3.2XJMB 240X2.8X (MISCELLANEOUS)
FORCEPS BIOP RAD 4 LRG CAP 4 (CUTTING FORCEPS) IMPLANT
FORCEPS BIOP RJ4 240 W/NDL (MISCELLANEOUS)
FORCEPS ESCP3.2XJMB 240X2.8X (MISCELLANEOUS) IMPLANT
GOWN CVR UNV OPN BCK APRN NK (MISCELLANEOUS) ×4 IMPLANT
GOWN ISOL THUMB LOOP REG UNIV (MISCELLANEOUS) ×4
HEMOCLIP INSTINCT (CLIP) IMPLANT
INJECTOR VARIJECT VIN23 (MISCELLANEOUS) IMPLANT
KIT CO2 TUBING (TUBING) IMPLANT
KIT DEFENDO VALVE AND CONN (KITS) IMPLANT
KIT ENDO PROCEDURE OLY (KITS) ×4 IMPLANT
LIGATOR MULTIBAND 6SHOOTER MBL (MISCELLANEOUS) IMPLANT
MARKER SPOT ENDO TATTOO 5ML (MISCELLANEOUS) IMPLANT
PAD GROUND ADULT SPLIT (MISCELLANEOUS) IMPLANT
SNARE SHORT THROW 13M SML OVAL (MISCELLANEOUS) ×4 IMPLANT
SNARE SHORT THROW 30M LRG OVAL (MISCELLANEOUS) IMPLANT
SPOT EX ENDOSCOPIC TATTOO (MISCELLANEOUS)
SUCTION POLY TRAP 4CHAMBER (MISCELLANEOUS) IMPLANT
TRAP SUCTION POLY (MISCELLANEOUS) ×4 IMPLANT
TUBING CONN 6MMX3.1M (TUBING)
TUBING SUCTION CONN 0.25 STRL (TUBING) IMPLANT
UNDERPAD 30X60 958B10 (PK) (MISCELLANEOUS) IMPLANT
VALVE BIOPSY ENDO (VALVE) IMPLANT
VARIJECT INJECTOR VIN23 (MISCELLANEOUS)
WATER AUXILLARY (MISCELLANEOUS) IMPLANT
WATER STERILE IRR 250ML POUR (IV SOLUTION) ×4 IMPLANT
WATER STERILE IRR 500ML POUR (IV SOLUTION) IMPLANT

## 2015-03-14 NOTE — Anesthesia Procedure Notes (Signed)
Procedure Name: MAC Performed by: Kyli Sorter Pre-anesthesia Checklist: Patient identified, Emergency Drugs available, Suction available, Patient being monitored and Timeout performed Patient Re-evaluated:Patient Re-evaluated prior to inductionOxygen Delivery Method: Nasal cannula       

## 2015-03-14 NOTE — Anesthesia Preprocedure Evaluation (Signed)
Anesthesia Evaluation  Patient identified by MRN, date of birth, ID band  Reviewed: NPO status   History of Anesthesia Complications Negative for: history of anesthetic complications  Airway Mallampati: II  TM Distance: >3 FB Neck ROM: full    Dental no notable dental hx.    Pulmonary neg pulmonary ROS,    Pulmonary exam normal        Cardiovascular Exercise Tolerance: Good Normal cardiovascular exam+ Valvular Problems/Murmurs (murmur at birth > resolved)      Neuro/Psych  Headaches, PSYCHIATRIC DISORDERS Anxiety Depression PTSDFibromyalgia; Neuropathy arms;  negative psych ROS   GI/Hepatic negative GI ROS, Neg liver ROS,   Endo/Other  negative endocrine ROS  Renal/GU negative Renal ROS  negative genitourinary   Musculoskeletal  (+) Arthritis , Fibromyalgia -Fusion of spine of cervical region   Abdominal   Peds  Hematology negative hematology ROS (+)   Anesthesia Other Findings   Reproductive/Obstetrics                             Anesthesia Physical Anesthesia Plan  ASA: II  Anesthesia Plan: MAC   Post-op Pain Management:    Induction:   Airway Management Planned:   Additional Equipment:   Intra-op Plan:   Post-operative Plan:   Informed Consent: I have reviewed the patients History and Physical, chart, labs and discussed the procedure including the risks, benefits and alternatives for the proposed anesthesia with the patient or authorized representative who has indicated his/her understanding and acceptance.     Plan Discussed with: CRNA  Anesthesia Plan Comments:         Anesthesia Quick Evaluation

## 2015-03-14 NOTE — H&P (Signed)
[facility]  [address] [place], [place] [address] Phone: [phone] Fax : [phone]  Primary Care Physician:  Bobetta Lime, MD Primary Gastroenterologist:  Dr. Allen Norris  Pre-Procedure History & Physical: HPI:  Yvette Garza is a 54 y.o. female is here for a screening colonoscopy.   Past Medical History  Diagnosis Date  . Allergy   . Anxiety   . Arthritis   . Depression   . Heart murmur   . Hyperlipidemia     history of  . Hypertension     history of  . Post traumatic stress disorder (PTSD)   . History of shingles   . Restless leg syndrome   . Fibromyalgia   . Chronic fatigue syndrome   . Neuropathy     both arms and hands  . Anemia     resolved after hysterectomy    Past Surgical History  Procedure Laterality Date  . Abdominal hysterectomy  2014  . Cervical disc surgery  2014  . Appendectomy      age 63  . Dilation and curettage of uterus    . Cesarean section      3  . Rotator cuff repair Right     bone shaved also    Prior to Admission medications   Medication Sig Start Date End Date Taking? Authorizing Provider  buPROPion (WELLBUTRIN XL) 300 MG 24 hr tablet Take 1 tablet (300 mg total) by mouth daily. 02/18/15  Yes Bobetta Lime, MD  DULoxetine (CYMBALTA) 30 MG capsule Take 1 capsule (30 mg total) by mouth daily. 02/21/15  Yes Bobetta Lime, MD  naproxen (NAPROSYN) 500 MG tablet Take by mouth.   Yes Historical Provider, MD  rizatriptan (MAXALT) 10 MG tablet  02/04/15  Yes Historical Provider, MD  traZODone (DESYREL) 100 MG tablet Take 1 tablet (100 mg total) by mouth at bedtime as needed for sleep. 02/26/15  Yes Bobetta Lime, MD  LORazepam (ATIVAN) 1 MG tablet  02/04/15   Historical Provider, MD    Allergies as of 03/01/2015  . (No Known Allergies)    Family History  Problem Relation Age of Onset  . Hypertension Mother   . Asthma Sister   . Emphysema Sister   . Diabetes Maternal Aunt   . Hyperlipidemia Maternal Aunt     . Arthritis Maternal Grandmother   . Diabetes Maternal Grandmother   . Cataracts Maternal Grandmother   . Heart disease Maternal Grandmother     aortic tear  . Stroke Maternal Grandmother     Social History   Social History  . Marital Status: Married    Spouse Name: N/A  . Number of Children: 4  . Years of Education: N/A   Occupational History  . Not on file.   Social History Main Topics  . Smoking status: Never Smoker   . Smokeless tobacco: Not on file  . Alcohol Use: 0.0 oz/week    0 Standard drinks or equivalent per week     Comment: socially  . Drug Use: No  . Sexual Activity:    Partners: Male    Birth Control/ Protection: None   Other Topics Concern  . Not on file   Social History Narrative    Review of Systems: See HPI, otherwise negative ROS  Physical Exam: Pulse 67  Temp(Src) 97.5 F (36.4 C) (Temporal)  Resp 16  Ht 5\' 3"  (1.6 m)  Wt 189 lb (85.73 kg)  BMI 33.49 kg/m2  SpO2 98% General:   Alert,  pleasant and cooperative in NAD Head:  Normocephalic and atraumatic. Neck:  Supple; no masses or thyromegaly. Lungs:  Clear throughout to auscultation.    Heart:  Regular rate and rhythm. Abdomen:  Soft, nontender and nondistended. Normal bowel sounds, without guarding, and without rebound.   Neurologic:  Alert and  oriented x4;  grossly normal neurologically.  Impression/Plan: Yvette Garza is now here to undergo a screening colonoscopy.  Risks, benefits, and alternatives regarding colonoscopy have been reviewed with the patient.  Questions have been answered.  All parties agreeable.

## 2015-03-14 NOTE — Transfer of Care (Signed)
Immediate Anesthesia Transfer of Care Note  Patient: Yvette Garza  Procedure(s) Performed: Procedure(s) with comments: COLONOSCOPY WITH PROPOFOL (N/A) POLYPECTOMY - sigmoid colon polyp  Patient Location: PACU  Anesthesia Type: MAC  Level of Consciousness: awake, alert  and patient cooperative  Airway and Oxygen Therapy: Patient Spontanous Breathing and Patient connected to supplemental oxygen  Post-op Assessment: Post-op Vital signs reviewed, Patient's Cardiovascular Status Stable, Respiratory Function Stable, Patent Airway and No signs of Nausea or vomiting  Post-op Vital Signs: Reviewed and stable  Complications: No apparent anesthesia complications

## 2015-03-14 NOTE — Anesthesia Postprocedure Evaluation (Signed)
  Anesthesia Post-op Note  Patient: Yvette Garza  Procedure(s) Performed: Procedure(s) with comments: COLONOSCOPY WITH PROPOFOL (N/A) POLYPECTOMY - sigmoid colon polyp, transverse colon polyp  Anesthesia type:MAC  Patient location: PACU  Post pain: Pain level controlled  Post assessment: Post-op Vital signs reviewed, Patient's Cardiovascular Status Stable, Respiratory Function Stable, Patent Airway and No signs of Nausea or vomiting  Post vital signs: Reviewed and stable  Last Vitals:  Filed Vitals:   03/14/15 0845  BP: 108/70  Pulse: 71  Temp:   Resp: 15    Level of consciousness: awake, alert  and patient cooperative  Complications: No apparent anesthesia complications

## 2015-03-14 NOTE — Op Note (Signed)
[facility] Patient Name: Yvette Garza Procedure Date: 03/14/2015 8:13 AM MRN: [id number] Account #: [id number] Date of Birth: 12/16/1960 Admit Type: Outpatient Age: 54 Room: [facility] OR ROOM 01 Gender: Female Note Status: Finalized Procedure:         Colonoscopy Indications:       Screening for colorectal malignant neoplasm Providers:         Lucilla Lame, MD Referring MD:      Bobetta Lime (Referring MD) Medicines:         Propofol per Anesthesia Complications:     No immediate complications. Procedure:         Pre-Anesthesia Assessment:                    - Prior to the procedure, a History and Physical was                     performed, and patient medications and allergies were                     reviewed. The patient's tolerance of previous anesthesia                     was also reviewed. The risks and benefits of the procedure                     and the sedation options and risks were discussed with the                     patient. All questions were answered, and informed consent                     was obtained. Prior Anticoagulants: The patient has taken                     no previous anticoagulant or antiplatelet agents. ASA                     Grade Assessment: II - A patient with mild systemic                     disease. After reviewing the risks and benefits, the                     patient was deemed in satisfactory condition to undergo                     the procedure.                    After obtaining informed consent, the colonoscope was                     passed under direct vision. Throughout the procedure, the                     patient's blood pressure, pulse, and oxygen saturations                     were monitored continuously. The Olympus CF-HQ190L                     Colonoscope (S#. [id number]) was introduced through the anus  and advanced to the the cecum, identified by appendiceal         orifice and ileocecal valve. The colonoscopy was performed                     without difficulty. The patient tolerated the procedure                     well. The quality of the bowel preparation was excellent. Findings:      The perianal and digital rectal examinations were normal.      A 9 mm polyp was found in the sigmoid colon. The polyp was pedunculated.       The polyp was removed with a hot snare. Resection and retrieval were       complete.      A 6 mm polyp was found in the transverse colon. The polyp was sessile.       The polyp was removed with a cold snare. Resection and retrieval were       complete. Impression:        - One 9 mm polyp in the sigmoid colon. Resected and                     retrieved.                    - One 6 mm polyp in the transverse colon. Resected and                     retrieved. Recommendation:    - Await pathology results.                    - Repeat colonoscopy in 5 years if polyp adenoma and 10                     years if hyperplastic Procedure Code(s): --- Professional ---                    [id number], Colonoscopy, flexible; with removal of tumor(s),                     polyp(s), or other lesion(s) by snare technique Diagnosis Code(s): --- Professional ---                    Z12.11, Encounter for screening for malignant neoplasm of                     colon                    D12.5, Benign neoplasm of sigmoid colon                    D12.3, Benign neoplasm of transverse colon CPT copyright 2014 American Medical Association. All rights reserved. The codes documented in this report are preliminary and upon coder review may  be revised to meet current compliance requirements. Lucilla Lame, MD 03/14/2015 8:39:26 AM This report has been signed electronically. Number of Addenda: 0 Note Initiated On: 03/14/2015 8:13 AM Scope Withdrawal Time: 0 hours 6 minutes 29 seconds  Total Procedure Duration: 0 hours 10 minutes 20 seconds       [facility]

## 2015-03-15 ENCOUNTER — Ambulatory Visit (INDEPENDENT_AMBULATORY_CARE_PROVIDER_SITE_OTHER): Payer: 59 | Admitting: Family Medicine

## 2015-03-15 ENCOUNTER — Encounter: Payer: Self-pay | Admitting: Gastroenterology

## 2015-03-15 VITALS — BP 122/84 | HR 76 | Temp 99.2°F | Resp 16 | Wt 192.3 lb

## 2015-03-15 DIAGNOSIS — R52 Pain, unspecified: Secondary | ICD-10-CM | POA: Diagnosis not present

## 2015-03-15 DIAGNOSIS — M797 Fibromyalgia: Secondary | ICD-10-CM | POA: Diagnosis not present

## 2015-03-15 DIAGNOSIS — G8929 Other chronic pain: Secondary | ICD-10-CM

## 2015-03-15 DIAGNOSIS — M4322 Fusion of spine, cervical region: Secondary | ICD-10-CM | POA: Diagnosis not present

## 2015-03-15 DIAGNOSIS — E669 Obesity, unspecified: Secondary | ICD-10-CM

## 2015-03-15 DIAGNOSIS — I10 Essential (primary) hypertension: Secondary | ICD-10-CM

## 2015-03-15 DIAGNOSIS — F418 Other specified anxiety disorders: Secondary | ICD-10-CM

## 2015-03-15 MED ORDER — CLONAZEPAM 0.5 MG PO TABS: 0.5000 mg | ORAL_TABLET | Freq: Every day | ORAL | Status: DC | PRN

## 2015-03-15 MED ORDER — BUPROPION HCL ER (XL) 300 MG PO TB24: 300.0000 mg | ORAL_TABLET | Freq: Every day | ORAL | Status: DC

## 2015-03-15 NOTE — Progress Notes (Signed)
Name: Yvette Garza   MRN: [id number]    DOB: 1961/06/11   Date:03/15/2015       Progress Note  Subjective  Chief Complaint  Chief Complaint  Patient presents with  . Advice Only    patient needs forms completed for work  . Medication Management    HPI Mrs Yvette Garza is a 54 year old female who is here today along with her husband to get their [facility] forms filled out regarding their increased BMI. They had not met their goals and need physician guided weight loss plan documented. Britteney reports to me appropriate concern regarding her weight and has started changes in her diet but not consistent exercise. She states her maximum weight was just over 200 lbs. Associted conditions include pain in joints and muscles for a long time, HTN, HLD.  Recent blood work is reviewed with them today. In regards to her mood disorder she is doing well on Wellbutrin however she needs a refill send to her local pharmacy.   Patient complains of arthralgias and myalgias for which has been present for several years. Pain is located in neck, shoulders, arms, is described as aching, and is constant . Associated symptoms include: decreased range of motion. The patient has tried cold, heat, NSAIDs, position change, rest and gabapentin for pain, with minimal relief. Long standing issue with Cervical Spine pain with recent anterior approach fusion surgery done Oct-Nov 2014 in [place] where she is relocating from today. She is concerned about ongoing restrictions in ROM of her neck and ongoing pain and weakness in her hands. Overall her fibromyalgia is not well controled. Depression well controled and insomnia well controled on current medication.  At one of our previous visits she was started on Danube however due to insurance limitations she was switched to Cymbalta, which she has not had the chance to take consistently yet.   Patient is here for evaluation of elevated blood pressures. Age at onset of  elevated blood pressure: Several years ago. Cardiac symptoms fatigue. Patient denies chest pain, chest pressure/discomfort, claudication, exertional chest pressure/discomfort, irregular heart beat, lower extremity edema, near-syncope, palpitations, syncope and tachypnea. Cardiovascular risk factors: HLD, hypertension, obesity (BMI >= 30 kg/m2) and sedentary lifestyle. Use of agents associated with hypertension: NSAIDS. History of target organ damage: none.  Was on lisinopril 5 mg one a day but with weight loss her HTN is lifestyle controled.   Patient Active Problem List   Diagnosis Date Noted  . Special screening for malignant neoplasms, colon   . Benign neoplasm of sigmoid colon   . Benign neoplasm of transverse colon   . Annual physical exam 02/12/2015  . Breast cancer screening 02/12/2015  . Colon cancer screening 02/12/2015  . Migraine without aura and without status migrainosus, not intractable 02/12/2015  . Cervical cancer screening 02/12/2015  . Fibromyalgia 01/17/2015  . Depression with anxiety 01/17/2015  . Insomnia due to medical condition 01/17/2015  . Hypertension goal BP (blood pressure) < 150/90 01/17/2015  . Chronic pain of multiple sites 01/17/2015  . Neuralgia, post-herpetic 01/17/2015  . Other fatigue 01/17/2015  . Restless leg 01/17/2015  . Fusion of spine of cervical region 01/17/2015  . Right rotator cuff tendinitis 01/17/2015    Social History  Substance Use Topics  . Smoking status: Never Smoker   . Smokeless tobacco: Not on file  . Alcohol Use: 0.0 oz/week    0 Standard drinks or equivalent per week     Comment: socially  Current outpatient prescriptions:  .  buPROPion (WELLBUTRIN XL) 300 MG 24 hr tablet, Take 1 tablet (300 mg total) by mouth daily., Disp: 30 tablet, Rfl: 5 .  DULoxetine (CYMBALTA) 30 MG capsule, Take 1 capsule (30 mg total) by mouth daily., Disp: 30 capsule, Rfl: 5 .  LORazepam (ATIVAN) 1 MG tablet, , Disp: , Rfl:  .  naproxen  (NAPROSYN) 500 MG tablet, Take by mouth., Disp: , Rfl:  .  rizatriptan (MAXALT) 10 MG tablet, , Disp: , Rfl:  .  traZODone (DESYREL) 100 MG tablet, Take 1 tablet (100 mg total) by mouth at bedtime as needed for sleep., Disp: 90 tablet, Rfl: 1  Past Surgical History  Procedure Laterality Date  . Abdominal hysterectomy  2014  . Cervical disc surgery  2014  . Appendectomy      age 36  . Dilation and curettage of uterus    . Cesarean section      3  . Rotator cuff repair Right     bone shaved also  . Colonoscopy with propofol N/A 03/14/2015    Procedure: COLONOSCOPY WITH PROPOFOL;  Surgeon: Lucilla Lame, MD;  Location: [facility];  Service: Endoscopy;  Laterality: N/A;  . Polypectomy  03/14/2015    Procedure: POLYPECTOMY;  Surgeon: Lucilla Lame, MD;  Location: [facility];  Service: Endoscopy;;  sigmoid colon polyp, transverse colon polyp    Family History  Problem Relation Age of Onset  . Hypertension Mother   . Asthma Sister   . Emphysema Sister   . Diabetes Maternal Aunt   . Hyperlipidemia Maternal Aunt   . Arthritis Maternal Grandmother   . Diabetes Maternal Grandmother   . Cataracts Maternal Grandmother   . Heart disease Maternal Grandmother     aortic tear  . Stroke Maternal Grandmother     No Known Allergies   Review of Systems  CONSTITUTIONAL: No significant weight changes, fever, chills, weakness or fatigue.  HEENT:  - Eyes: No visual changes.  - Ears: No auditory changes. No pain.  - Nose: No sneezing, congestion, runny nose. - Throat: No sore throat. No changes in swallowing. SKIN: No rash or itching.  CARDIOVASCULAR: No chest pain, chest pressure or chest discomfort. No palpitations or edema.  RESPIRATORY: No shortness of breath, cough or sputum.  GASTROINTESTINAL: No anorexia, nausea, vomiting. No changes in bowel habits. No abdominal pain or blood.  GENITOURINARY: No dysuria. No frequency. No discharge.  NEUROLOGICAL: No headache, dizziness,  syncope, paralysis, ataxia, numbness or tingling in the extremities. No memory changes. No change in bowel or bladder control.  MUSCULOSKELETAL: Chron joint pain. Chronic muscle pain. HEMATOLOGIC: No anemia, bleeding or bruising.  LYMPHATICS: No enlarged lymph nodes.  PSYCHIATRIC: No change in mood. No change in sleep pattern.  ENDOCRINOLOGIC: No reports of sweating, cold or heat intolerance. No polyuria or polydipsia.     Objective  BP 122/84 mmHg  Pulse 76  Temp(Src) 99.2 F (37.3 C) (Oral)  Resp 16  Wt 192 lb 4.8 oz (87.227 kg)  SpO2 98% Body mass index is 34.07 kg/(m^2).  Physical Exam  Constitutional: Patient is obese, well developed and well nourished. In no distress.  HEENT:  - Head: Normocephalic and atraumatic.  - Ears: Bilateral TMs gray, no erythema or effusion - Nose: Nasal mucosa moist - Mouth/Throat: Oropharynx is clear and moist. No tonsillar hypertrophy or erythema. No post nasal drainage.  - Eyes: Conjunctivae clear, EOM movements normal. PERRLA. No scleral icterus.  Neck: Normal range of motion.  Neck supple. No JVD present. No thyromegaly present.  Cardiovascular: Normal rate, regular rhythm and normal heart sounds.  No murmur heard.  Pulmonary/Chest: Effort normal and breath sounds normal. No respiratory distress. Musculoskeletal:  C-spine no palpable step off or point tenderness, ROM restricted side to side movement about 50degrees, pain worse with chin to chest. Bilateral UE strength 4/5 at shoulder joints and hand grip. Sensation intact.  Peripheral vascular: Bilateral LE no edema. Neurological: CN II-XII grossly intact with no focal deficits. Alert and oriented to person, place, and time. Coordination, balance, strength, speech and gait are normal.  Skin: Skin is warm and dry. No rash noted. No erythema.  Psychiatric: Patient has a stable mood and affect. Behavior is normal in office today. Judgment and thought content normal in office today.   Recent  Results (from the past 2160 hour(s))  CBC with Differential/Platelet     Status: Abnormal   Collection Time: 02/06/15  9:55 AM  Result Value Ref Range   WBC 4.5 3.4 - 10.8 x10E3/uL   RBC 4.40 3.77 - 5.28 x10E6/uL   Hemoglobin 11.2 11.1 - 15.9 g/dL   Hematocrit 35.2 34.0 - 46.6 %   MCV 80 79 - 97 fL   MCH 25.5 (L) 26.6 - 33.0 pg   MCHC 31.8 31.5 - 35.7 g/dL   RDW 16.9 (H) 12.3 - 15.4 %   Platelets 182 150 - 379 x10E3/uL   Neutrophils 58 %   Lymphs 33 %   Monocytes 7 %   Eos 2 %   Basos 0 %   Neutrophils Absolute 2.6 1.4 - 7.0 x10E3/uL   Lymphocytes Absolute 1.5 0.7 - 3.1 x10E3/uL   Monocytes Absolute 0.3 0.1 - 0.9 x10E3/uL   EOS (ABSOLUTE) 0.1 0.0 - 0.4 x10E3/uL   Basophils Absolute 0.0 0.0 - 0.2 x10E3/uL   Immature Granulocytes 0 %   Immature Grans (Abs) 0.0 0.0 - 0.1 x10E3/uL  Comprehensive metabolic panel     Status: None   Collection Time: 02/06/15  9:55 AM  Result Value Ref Range   Glucose 96 65 - 99 mg/dL   BUN 17 6 - 24 mg/dL   Creatinine, Ser 0.81 0.57 - 1.00 mg/dL   GFR calc non Af Amer 84 >59 mL/min/1.73   GFR calc Af Amer 97 >59 mL/min/1.73   BUN/Creatinine Ratio 21 9 - 23   Sodium 142 134 - 144 mmol/L   Potassium 4.4 3.5 - 5.2 mmol/L   Chloride 103 97 - 108 mmol/L   CO2 24 18 - 29 mmol/L   Calcium 9.2 8.7 - 10.2 mg/dL   Total Protein 6.4 6.0 - 8.5 g/dL   Albumin 4.1 3.5 - 5.5 g/dL   Globulin, Total 2.3 1.5 - 4.5 g/dL   Albumin/Globulin Ratio 1.8 1.1 - 2.5   Bilirubin Total 0.4 0.0 - 1.2 mg/dL   Alkaline Phosphatase 57 39 - 117 IU/L   AST 12 0 - 40 IU/L   ALT 10 0 - 32 IU/L  Lipid panel     Status: Abnormal   Collection Time: 02/06/15  9:55 AM  Result Value Ref Range   Cholesterol, Total 201 (H) 100 - 199 mg/dL   Triglycerides 218 (H) 0 - 149 mg/dL   HDL 42 >39 mg/dL    Comment: According to ATP-III Guidelines, HDL-C >59 mg/dL is considered a negative risk factor for CHD.    VLDL Cholesterol Cal 44 (H) 5 - 40 mg/dL   LDL Calculated 115 (H) 0 - 99  mg/dL   Chol/HDL Ratio 4.8 (H) 0.0 - 4.4 ratio units    Comment:                                   T. Chol/HDL Ratio                                             Men  Women                               1/2 Avg.Risk  3.4    3.3                                   Avg.Risk  5.0    4.4                                2X Avg.Risk  9.6    7.1                                3X Avg.Risk 23.4   11.0   TSH     Status: None   Collection Time: 02/06/15  9:55 AM  Result Value Ref Range   TSH 3.220 0.450 - 4.500 uIU/mL  Sedimentation rate     Status: None   Collection Time: 02/06/15  9:55 AM  Result Value Ref Range   Sed Rate 7 0 - 40 mm/hr  Hemoglobin A1c     Status: Abnormal   Collection Time: 02/06/15  9:55 AM  Result Value Ref Range   Hgb A1c MFr Bld 5.9 (H) 4.8 - 5.6 %    Comment:          Pre-diabetes: 5.7 - 6.4          Diabetes: >6.4          Glycemic control for adults with diabetes: <7.0    Est. average glucose Bld gHb Est-mCnc 123 mg/dL  Pap IG w/ reflex to HPV when ASC-U     Status: None   Collection Time: 02/12/15 12:00 AM  Result Value Ref Range   DIAGNOSIS: Comment     Comment: NEGATIVE FOR INTRAEPITHELIAL LESION AND MALIGNANCY. PREDOMINANCE OF COCCOBACILLI CONSISTENT WITH SHIFT IN VAGINAL FLORA IS PRESENT. THIS SPECIMEN WAS RESCREENED AS PART OF OUR QUALITY CONTROL PROGRAM.    Specimen adequacy: Comment     Comment: Satisfactory for evaluation. No endocervical cells are present. This is consistent with a history of hysterectomy.    CLINICIAN PROVIDED ICD10: Comment     Comment: Z12.4   Performed by: Comment     Comment: Jimmy Picket, Supervisory Cytotechnologist (ASCP)   QC reviewed by: Comment     Comment: Larey Brick, Supervisory Cytotechnologist (ASCP)   PAP SMEAR COMMENT .    PATHOLOGIST PROVIDED ICD10: Comment     Comment: R87.5   Note: Comment     Comment: The Pap smear is a screening test designed to aid in the detection of premalignant and malignant conditions  of the uterine cervix.  It is not a diagnostic procedure and should  not be used as the sole means of detecting cervical cancer.  Both false-positive and false-negative reports do occur.    Test Methodology CANCELED     Comment: The Thin Prep(R) Imager was unable to read this specimen.  Therefore a manual review was performed.  Result canceled by the ancillary    PAP REFLEX: Comment     Comment: The HPV DNA reflex criteria were not met with this specimen result therefore, no HPV testing was performed.      Assessment & Plan  1. Obesity, Class I, BMI 30.0-34.9 (see actual BMI) [facility] [employer] form filled out with recommendations for weight loss plan. Follow up every 3-4 months for surveillance of weight loss goals.   The patient has been counseled on their higher than normal BMI.  They have verbally expressed understanding their increased risk for other diseases.  In efforts to meet a better target BMI goal the patient has been counseled on lifestyle, diet and exercise modification tactics. Start with moderate intensity aerobic exercise (walking, jogging, elliptical, swimming, group or individual sports, hiking) at least 45mns a day at least 4 days a week and increase intensity, duration, frequency as tolerated. Diet should include 06-1499 Kcal, well balance fresh fruits and vegetables avoiding processed foods, carbohydrates and sugars. Drink at least 8oz 10 glasses a day avoiding sodas, sugary fruit drinks, sweetened tea. Check weight on a reliable scale daily and monitor weight loss progress daily. Consider investing in mobile phone apps that will help keep track of weight loss goals.   2. Depression with anxiety Clinically stable findings based on clinical exam. Recommended to patient that they continue their current regimen with regular follow ups.  - buPROPion (WELLBUTRIN XL) 300 MG 24 hr tablet; Take 1 tablet (300 mg total) by mouth daily.  Dispense: 90 tablet; Refill: 3 - clonazePAM  (KLONOPIN) 0.5 MG tablet; Take 1 tablet (0.5 mg total) by mouth daily as needed for anxiety.  Dispense: 30 tablet; Refill: 5  3. Hypertension goal BP (blood pressure) < 140/90 Lifestyle controled.   4. Fibromyalgia Has not yet given Cymbalta a fair trial yet.   5. Fusion of spine of cervical region Stable findings but if symptoms are ongoing may need further PT or consultation with spinal specialist.   6. Chronic pain of multiple sites Medication options if Cymbalta is not effective include gabapentin or lyrica or Savella or Celebrex or amitriptyline.

## 2015-03-17 DIAGNOSIS — E669 Obesity, unspecified: Secondary | ICD-10-CM | POA: Insufficient documentation

## 2015-03-20 ENCOUNTER — Encounter: Payer: Self-pay | Admitting: Gastroenterology

## 2015-03-28 ENCOUNTER — Other Ambulatory Visit: Payer: Self-pay

## 2015-03-28 DIAGNOSIS — F418 Other specified anxiety disorders: Secondary | ICD-10-CM

## 2015-03-28 MED ORDER — CLONAZEPAM 0.5 MG PO TABS: 0.5000 mg | ORAL_TABLET | Freq: Every day | ORAL | Status: DC | PRN

## 2015-03-28 NOTE — Telephone Encounter (Signed)
Got a fax from [facility] requesting a refill on this patient's klonopin 0.5mg .  Refill request was sent to Dr. Bobetta Lime for approval and submission.

## 2015-04-01 ENCOUNTER — Other Ambulatory Visit: Payer: Self-pay

## 2015-04-01 DIAGNOSIS — F418 Other specified anxiety disorders: Secondary | ICD-10-CM

## 2015-04-01 NOTE — Telephone Encounter (Signed)
Refill request was sent to Dr. Bobetta Lime for approval and submission.  RX was previously sent to [facility] instead of [facility]

## 2015-04-02 MED ORDER — CLONAZEPAM 0.5 MG PO TABS: 0.5000 mg | ORAL_TABLET | Freq: Every day | ORAL | Status: DC | PRN

## 2015-06-10 ENCOUNTER — Other Ambulatory Visit: Payer: Self-pay | Admitting: Family Medicine

## 2015-06-24 ENCOUNTER — Other Ambulatory Visit: Payer: Self-pay

## 2015-06-24 DIAGNOSIS — F418 Other specified anxiety disorders: Secondary | ICD-10-CM

## 2015-06-24 NOTE — Telephone Encounter (Signed)
Got a fax from [facility] requesting a refill of this patient's wellbutrin.  Refill request was sent to Dr. Bobetta Lime for approval and submission.

## 2015-06-25 MED ORDER — BUPROPION HCL ER (XL) 300 MG PO TB24: 300.0000 mg | ORAL_TABLET | Freq: Every day | ORAL | Status: DC

## 2015-07-05 ENCOUNTER — Encounter: Payer: Self-pay | Admitting: Family Medicine

## 2015-07-05 ENCOUNTER — Ambulatory Visit (INDEPENDENT_AMBULATORY_CARE_PROVIDER_SITE_OTHER): Payer: 59 | Admitting: Family Medicine

## 2015-07-05 VITALS — BP 168/100 | HR 89 | Temp 98.6°F | Resp 16 | Wt 198.6 lb

## 2015-07-05 DIAGNOSIS — G4701 Insomnia due to medical condition: Secondary | ICD-10-CM | POA: Diagnosis not present

## 2015-07-05 DIAGNOSIS — I10 Essential (primary) hypertension: Secondary | ICD-10-CM | POA: Diagnosis not present

## 2015-07-05 DIAGNOSIS — F3341 Major depressive disorder, recurrent, in partial remission: Secondary | ICD-10-CM | POA: Diagnosis not present

## 2015-07-05 DIAGNOSIS — F418 Other specified anxiety disorders: Principal | ICD-10-CM

## 2015-07-05 MED ORDER — CLONAZEPAM 1 MG PO TABS: 1.0000 mg | ORAL_TABLET | Freq: Two times a day (BID) | ORAL | Status: DC | PRN

## 2015-07-05 MED ORDER — ESCITALOPRAM OXALATE 10 MG PO TABS: 10.0000 mg | ORAL_TABLET | Freq: Every day | ORAL | Status: DC

## 2015-07-05 MED ORDER — LISINOPRIL 10 MG PO TABS: 10.0000 mg | ORAL_TABLET | Freq: Every day | ORAL | Status: DC

## 2015-07-05 NOTE — Progress Notes (Signed)
Name: Yvette Garza   MRN: [id number]    DOB: Nov 11, 1960   Date:07/05/2015       Progress Note  Subjective  Chief Complaint  Chief Complaint  Patient presents with  . Advice Only    patient is under a lot of emotional stress and has not been sleeping well.  . Anxiety    patient was told that she may need a rx.    HPI  Yvette Garza is a 54 year old female here for follow up.   In regards to her mood disorder she was doing well on Wellbutrin however due to possible fibromyalgia we added on Cymbalta 30 mg at our last visit.  Today she reports never having started the medication.  Patient complains of arthralgias and myalgias for which has been present for several years. Pain is located in neck, shoulders, arms, is described as aching, and is constant . Associated symptoms include: decreased range of motion. The patient has tried cold, heat, NSAIDs, position change, rest and gabapentin for pain, with minimal relief. Long standing issue with Cervical Spine pain with recent anterior approach fusion surgery done Oct-Nov 2014 in [place] where she is relocating from today. She is concerned about ongoing restrictions in ROM of her neck and ongoing pain and weakness in her hands.   Overall her fibromyalgia is not well controled. Depression is today reported to be not well controled and insomnia not well controled on current medication. Although just in September she reported good symptom control.   Is on Benzodiazepine prn for anxiety as well as trazadone for sleep. Notes that she has tried up to 200 mg of trazadone but makes her groggy during the day. Currently using clonazepam 0.5mg  during day and 0.5 mg with trazadone 100 mg at night which helps her sleep until about 12-2am but she wakes up after that.   Patient is here for evaluation of elevated blood pressures. Age at onset of elevated blood pressure: Several years ago. Cardiac symptoms fatigue. Patient denies chest pain, chest  pressure/discomfort, claudication, exertional chest pressure/discomfort, irregular heart beat, lower extremity edema, near-syncope, palpitations, syncope and tachypnea. Cardiovascular risk factors: HLD, hypertension, obesity (BMI >= 30 kg/m2) and sedentary lifestyle. Use of agents associated with hypertension: NSAIDS. History of target organ damage: none. Was on lisinopril 5 mg one a day but with weight loss her HTN is lifestyle controled.   Past Medical History  Diagnosis Date  . Allergy   . Anxiety   . Arthritis   . Depression   . Heart murmur   . Hyperlipidemia     history of  . Hypertension     history of  . Post traumatic stress disorder (PTSD)   . History of shingles   . Restless leg syndrome   . Fibromyalgia   . Chronic fatigue syndrome   . Neuropathy (HCC)     both arms and hands  . Anemia     resolved after hysterectomy    Patient Active Problem List   Diagnosis Date Noted  . Obesity, Class I, BMI 30.0-34.9 (see actual BMI) 03/17/2015  . Benign neoplasm of sigmoid colon   . Benign neoplasm of transverse colon   . Migraine without aura and without status migrainosus, not intractable 02/12/2015  . Fibromyalgia 01/17/2015  . Major depressive disorder, recurrent episode, in partial remission with anxious distress ([place]) 01/17/2015  . Insomnia due to medical condition 01/17/2015  . Hypertension goal BP (blood pressure) < 140/90 01/17/2015  . Chronic pain of  multiple sites 01/17/2015  . Neuralgia, post-herpetic 01/17/2015  . Other fatigue 01/17/2015  . Restless leg 01/17/2015  . Fusion of spine of cervical region 01/17/2015  . Right rotator cuff tendinitis 01/17/2015    Social History  Substance Use Topics  . Smoking status: Never Smoker   . Smokeless tobacco: Not on file  . Alcohol Use: 0.0 oz/week    0 Standard drinks or equivalent per week     Comment: socially     Current outpatient prescriptions:  .  buPROPion (WELLBUTRIN XL) 300 MG 24 hr tablet, Take 1  tablet (300 mg total) by mouth daily., Disp: 90 tablet, Rfl: 2 .  clonazePAM (KLONOPIN) 0.5 MG tablet, Take 1 tablet (0.5 mg total) by mouth daily as needed for anxiety., Disp: 30 tablet, Rfl: 5 .  DULoxetine (CYMBALTA) 30 MG capsule, Take 1 capsule (30 mg total) by mouth daily., Disp: 30 capsule, Rfl: 5 .  naproxen (NAPROSYN) 500 MG tablet, Take by mouth., Disp: , Rfl:  .  rizatriptan (MAXALT) 10 MG tablet, , Disp: , Rfl:  .  traZODone (DESYREL) 100 MG tablet, Take 1 tablet by mouth at  bedtime as needed for sleep, Disp: 90 tablet, Rfl: 2  Past Surgical History  Procedure Laterality Date  . Abdominal hysterectomy  2014  . Cervical disc surgery  2014  . Appendectomy      age 67  . Dilation and curettage of uterus    . Cesarean section      3  . Rotator cuff repair Right     bone shaved also  . Colonoscopy with propofol N/A 03/14/2015    Procedure: COLONOSCOPY WITH PROPOFOL;  Surgeon: Lucilla Lame, MD;  Location: [facility];  Service: Endoscopy;  Laterality: N/A;  . Polypectomy  03/14/2015    Procedure: POLYPECTOMY;  Surgeon: Lucilla Lame, MD;  Location: [facility];  Service: Endoscopy;;  sigmoid colon polyp, transverse colon polyp    Family History  Problem Relation Age of Onset  . Hypertension Mother   . Asthma Sister   . Emphysema Sister   . Diabetes Maternal Aunt   . Hyperlipidemia Maternal Aunt   . Arthritis Maternal Grandmother   . Diabetes Maternal Grandmother   . Cataracts Maternal Grandmother   . Heart disease Maternal Grandmother     aortic tear  . Stroke Maternal Grandmother     No Known Allergies   Review of Systems  CONSTITUTIONAL: No significant weight changes, fever, chills, weakness or fatigue.  CARDIOVASCULAR: No chest pain, chest pressure or chest discomfort. No palpitations or edema.  RESPIRATORY: No shortness of breath, cough or sputum.  NEUROLOGICAL: No headache, dizziness, syncope, paralysis, ataxia, numbness or tingling in the  extremities. No memory changes. No change in bowel or bladder control.  MUSCULOSKELETAL: Yes joint pain. Yes muscle pain. PSYCHIATRIC: Yes change in mood. Yes change in sleep pattern.  ENDOCRINOLOGIC: No reports of sweating, cold or heat intolerance. No polyuria or polydipsia.     Objective  BP 168/100 mmHg  Pulse 89  Temp(Src) 98.6 F (37 C) (Oral)  Resp 16  Wt 198 lb 9.6 oz (90.084 kg)  SpO2 97% Body mass index is 35.19 kg/(m^2).  Physical Exam  Constitutional: Patient appears obese and well-nourished. In no distress.  Cardiovascular: Normal rate, regular rhythm and normal heart sounds.  No murmur heard.  Pulmonary/Chest: Effort normal and breath sounds normal. No respiratory distress. Musculoskeletal: Normal range of motion bilateral UE and LE, no joint effusions. Peripheral vascular: Bilateral LE  no edema. Neurological: CN II-XII grossly intact with no focal deficits. Alert and oriented to person, place, and time. Coordination, balance, strength, speech and gait are normal.  Skin: Skin is warm and dry. No rash noted. No erythema.  Psychiatric: Patient has a depressed tearful mood and affect. Behavior is normal in office today. Judgment and thought content normal in office today.   Assessment & Plan  1. Major depressive disorder, recurrent episode, in partial remission with anxious distress (HCC) Suboptimal control. Denies suicidal or homicidal ideations. Discussed ER visit if feeling these symptoms. Never started Cymbalta. Still taking Wellbutrin alone. Plan to start SNRI and increased benzo dose. Needs therapy as well.   The patient has been counseled on the proper use, side effects and potential interactions of the new medication. Patient encouraged to review the side effects and safety profile pamphlet provided with the prescription from the pharmacy as well as request counseling from the pharmacy team as needed.    - clonazePAM (KLONOPIN) 1 MG tablet; Take 1 tablet (1 mg  total) by mouth 2 (two) times daily as needed for anxiety.  Dispense: 60 tablet; Refill: 5 - escitalopram (LEXAPRO) 10 MG tablet; Take 1 tablet (10 mg total) by mouth daily.  Dispense: 30 tablet; Refill: 3 - Ambulatory referral to Psychology  2. Insomnia due to medical condition  - clonazePAM (KLONOPIN) 1 MG tablet; Take 1 tablet (1 mg total) by mouth 2 (two) times daily as needed for anxiety.  Dispense: 60 tablet; Refill: 5 - escitalopram (LEXAPRO) 10 MG tablet; Take 1 tablet (10 mg total) by mouth daily.  Dispense: 30 tablet; Refill: 3 - Ambulatory referral to Psychology  3. Hypertension goal BP (blood pressure) < 140/90 Re start Lisinopril 10 mg a day.  - lisinopril (PRINIVIL,ZESTRIL) 10 MG tablet; Take 1 tablet (10 mg total) by mouth daily.  Dispense: 30 tablet; Refill: 3

## 2015-09-04 HISTORY — PX: BREAST BIOPSY: SHX20

## 2015-09-17 ENCOUNTER — Other Ambulatory Visit: Payer: Self-pay

## 2015-09-17 ENCOUNTER — Ambulatory Visit
Admission: RE | Admit: 2015-09-17 | Discharge: 2015-09-17 | Disposition: A | Discharge status: Home / Self Care | Payer: 59 | Source: Ambulatory Visit | Attending: Family Medicine | Admitting: Family Medicine

## 2015-09-17 DIAGNOSIS — Z1231 Encounter for screening mammogram for malignant neoplasm of breast: Secondary | ICD-10-CM | POA: Diagnosis not present

## 2015-09-17 DIAGNOSIS — F418 Other specified anxiety disorders: Principal | ICD-10-CM

## 2015-09-17 DIAGNOSIS — Z1239 Encounter for other screening for malignant neoplasm of breast: Secondary | ICD-10-CM

## 2015-09-17 DIAGNOSIS — F3341 Major depressive disorder, recurrent, in partial remission: Secondary | ICD-10-CM

## 2015-09-17 DIAGNOSIS — G4701 Insomnia due to medical condition: Secondary | ICD-10-CM

## 2015-09-18 ENCOUNTER — Other Ambulatory Visit: Payer: Self-pay | Admitting: Family Medicine

## 2015-09-18 DIAGNOSIS — R928 Other abnormal and inconclusive findings on diagnostic imaging of breast: Secondary | ICD-10-CM | POA: Insufficient documentation

## 2015-09-18 MED ORDER — CLONAZEPAM 1 MG PO TABS: 1.0000 mg | ORAL_TABLET | Freq: Two times a day (BID) | ORAL | Status: DC | PRN

## 2015-09-19 ENCOUNTER — Other Ambulatory Visit: Payer: Self-pay | Admitting: Family Medicine

## 2015-09-19 ENCOUNTER — Ambulatory Visit
Admission: RE | Admit: 2015-09-19 | Discharge: 2015-09-19 | Disposition: A | Discharge status: Home / Self Care | Payer: 59 | Source: Ambulatory Visit | Attending: Family Medicine | Admitting: Family Medicine

## 2015-09-19 DIAGNOSIS — R928 Other abnormal and inconclusive findings on diagnostic imaging of breast: Secondary | ICD-10-CM

## 2015-09-19 DIAGNOSIS — N6489 Other specified disorders of breast: Secondary | ICD-10-CM | POA: Insufficient documentation

## 2015-10-01 ENCOUNTER — Ambulatory Visit
Admission: RE | Admit: 2015-10-01 | Discharge: 2015-10-01 | Disposition: A | Discharge status: Home / Self Care | Payer: 59 | Source: Ambulatory Visit | Attending: Family Medicine | Admitting: Family Medicine

## 2015-10-01 DIAGNOSIS — N6031 Fibrosclerosis of right breast: Secondary | ICD-10-CM | POA: Insufficient documentation

## 2015-10-01 DIAGNOSIS — Z9889 Other specified postprocedural states: Secondary | ICD-10-CM | POA: Diagnosis not present

## 2015-10-01 DIAGNOSIS — R928 Other abnormal and inconclusive findings on diagnostic imaging of breast: Secondary | ICD-10-CM

## 2015-10-02 LAB — SURGICAL PATHOLOGY

## 2015-11-19 ENCOUNTER — Other Ambulatory Visit: Payer: Self-pay

## 2015-11-19 DIAGNOSIS — F3341 Major depressive disorder, recurrent, in partial remission: Secondary | ICD-10-CM

## 2015-11-19 DIAGNOSIS — F418 Other specified anxiety disorders: Principal | ICD-10-CM

## 2015-11-19 DIAGNOSIS — G4701 Insomnia due to medical condition: Secondary | ICD-10-CM

## 2015-11-19 NOTE — Telephone Encounter (Signed)
[place] site reviewed Patient received prescription of controlled substance (clonazepam) from Dr. Lavera Guise on October 29, 2015 Rx request denied

## 2016-01-31 ENCOUNTER — Ambulatory Visit (INDEPENDENT_AMBULATORY_CARE_PROVIDER_SITE_OTHER): Payer: 59 | Admitting: Family Medicine

## 2016-01-31 ENCOUNTER — Encounter: Payer: Self-pay | Admitting: Family Medicine

## 2016-01-31 DIAGNOSIS — R7303 Prediabetes: Secondary | ICD-10-CM

## 2016-01-31 DIAGNOSIS — F3341 Major depressive disorder, recurrent, in partial remission: Secondary | ICD-10-CM

## 2016-01-31 DIAGNOSIS — E7800 Pure hypercholesterolemia, unspecified: Secondary | ICD-10-CM

## 2016-01-31 DIAGNOSIS — F418 Other specified anxiety disorders: Secondary | ICD-10-CM

## 2016-01-31 DIAGNOSIS — M797 Fibromyalgia: Secondary | ICD-10-CM

## 2016-01-31 DIAGNOSIS — E669 Obesity, unspecified: Secondary | ICD-10-CM

## 2016-01-31 DIAGNOSIS — I10 Essential (primary) hypertension: Secondary | ICD-10-CM

## 2016-01-31 DIAGNOSIS — F4310 Post-traumatic stress disorder, unspecified: Secondary | ICD-10-CM | POA: Insufficient documentation

## 2016-01-31 HISTORY — DX: Pure hypercholesterolemia, unspecified: E78.00

## 2016-01-31 HISTORY — DX: Post-traumatic stress disorder, unspecified: F43.10

## 2016-01-31 HISTORY — DX: Prediabetes: R73.03

## 2016-01-31 MED ORDER — LISINOPRIL 10 MG PO TABS
10.0000 mg | ORAL_TABLET | Freq: Every day | ORAL | 0 refills | Status: DC
Start: 2016-01-31 — End: 2016-09-08

## 2016-01-31 MED ORDER — TRAZODONE HCL 100 MG PO TABS: 150.0000 mg | ORAL_TABLET | Freq: Every evening | ORAL | 0 refills | Status: DC | PRN

## 2016-01-31 MED ORDER — BUPROPION HCL ER (XL) 300 MG PO TB24: 300.0000 mg | ORAL_TABLET | Freq: Every day | ORAL | 0 refills | Status: DC

## 2016-01-31 NOTE — Assessment & Plan Note (Signed)
Refer to psychiatrist, encouarged her to get into counseling

## 2016-01-31 NOTE — Assessment & Plan Note (Signed)
See AVS; encouragement for healthy eating

## 2016-01-31 NOTE — Assessment & Plan Note (Signed)
Ask psychiatrist to consider Cymbalta; yoga and stretching; good sleep important; increase trazodone to 150 mg at night but no higher

## 2016-01-31 NOTE — Assessment & Plan Note (Signed)
Check fasting labs on another day

## 2016-01-31 NOTE — Assessment & Plan Note (Signed)
Refer to psych 

## 2016-01-31 NOTE — Progress Notes (Signed)
BP 132/88   Pulse 98   Temp 99 F (37.2 C) (Oral)   Resp 14   Wt 194 lb (88 kg)   SpO2 95%   BMI 34.37 kg/m    Subjective:    Patient ID: Yvette Garza, female    DOB: 1960-09-14, 55 y.o.   MRN: [id number]  HPI: Yvette Garza is a 55 y.o. female  Chief Complaint  Patient presents with  . Medication Refill   She is here for f/u; she is new to me; her previous provider left the office  She takes wellbutrin, takes that for depression; she wants to talk about that; had a big "nosedive" several years back, but was depressed before that; has PTSD from childhood trauma; was a spiral effect, lost baby, house, husband, job; has not worked since 2003; has pain and had several surgeries for her neck, RDS, shoulder surgery and injections; she had been seeing Dr. Manuella Ghazi neurologist; he gave her some sort of nose thing for headaches, did not really help; she never started taking the escitalopram, but has the bottle; she had been doing well on wellbutrin and she says she didn't know if she should stop it or take it with it; she used to take zoloft  She has had "really bad anxiety"; she has been going through issues for a long time; she gets flare up and triggers that are hard to deal with; she has been on clonazepam, "not too long"; Dr. Nadine Counts had given her a prescription, the [facility] would only give her 0.5 mg; Dr. Nadine Counts wrote her for 1 mg; [facility] said "they can't do that"; she would sometimes take two pills of the 0.5 mg three times a day; her last dose of that was a month ago, maybe two weeks ago, just when needed  Has fibromyalgia; on trazodone for sleep; not taking SSRI; taking trazodone since 2006; has been under a lot of stress this week and not sleeping well; she started trazodone 50 mg nightly, then they upped her to 100 mg for a few years now; lately, she has been taking two trazodone at a time; she did not talk to a pharmacist about that or talk to a provider about that  Hypertension; on  ACE-I; the BP fluctuates she says; she has been under a lot of stress she says;s he does not add salt to her food  She has A1c in the prediabetes range Lab Results  Component Value Date   HGBA1C 5.9 (H) 02/06/2015   Last cholesterol panel was reviewed, TG high, recheck that on another day  She takes berberine (I cannot render an opinion I told her); she says it helps lower cholesterol and sugar  Depression screen [facility] 2/9 01/31/2016 07/05/2015 01/17/2015  Decreased Interest 0 1 2  Down, Depressed, Hopeless 1 2 1   PHQ - 2 Score 1 3 3   Altered sleeping - 3 3  Tired, decreased energy - 3 3  Change in appetite - 0 3  Feeling bad or failure about yourself  - 1 0  Trouble concentrating - 3 3  Moving slowly or fidgety/restless - 0 0  Suicidal thoughts - 0 0  PHQ-9 Score - 13 15  Difficult doing work/chores - - Very difficult   Relevant past medical, surgical, family and social history reviewed Past Medical History:  Diagnosis Date  . Allergy   . Anemia    resolved after hysterectomy  . Anxiety   . Arthritis   . Chronic fatigue syndrome   .  Depression   . Fibromyalgia   . Heart murmur   . History of shingles   . Hypercholesterolemia 01/31/2016  . Hyperlipidemia    history of  . Hypertension    history of  . Neuropathy (HCC)    both arms and hands  . Post traumatic stress disorder (PTSD)   . Post traumatic stress disorder (PTSD) 01/31/2016  . Prediabetes 01/31/2016  . Restless leg syndrome    Past Surgical History:  Procedure Laterality Date  . ABDOMINAL HYSTERECTOMY  2014  . APPENDECTOMY     age 16  . [place] SURGERY  2014  . CESAREAN SECTION     3  . COLONOSCOPY WITH PROPOFOL N/A 03/14/2015   Procedure: COLONOSCOPY WITH PROPOFOL;  Surgeon: Lucilla Lame, MD;  Location: [place];  Service: Endoscopy;  Laterality: N/A;  . DILATION AND CURETTAGE OF UTERUS    . POLYPECTOMY  03/14/2015   Procedure: POLYPECTOMY;  Surgeon: Lucilla Lame, MD;  Location: [place];  Service: Endoscopy;;  sigmoid colon polyp, transverse colon polyp  . ROTATOR CUFF REPAIR Right    bone shaved also   Family History  Problem Relation Age of Onset  . Hypertension Mother   . Asthma Sister   . Emphysema Sister   . Arthritis Maternal Grandmother   . Diabetes Maternal Grandmother   . Cataracts Maternal Grandmother   . Heart disease Maternal Grandmother     aortic tear  . Stroke Maternal Grandmother   . Diabetes Maternal Aunt   . Hyperlipidemia Maternal Aunt   . Breast cancer Neg Hx    Social History  Substance Use Topics  . Smoking status: Never Smoker  . Smokeless tobacco: Never Used  . Alcohol use 0.0 oz/week     Comment: socially   Interim medical history since last visit reviewed. Allergies and medications reviewed  Review of Systems Per HPI unless specifically indicated above     Objective:    BP 132/88   Pulse 98   Temp 99 F (37.2 C) (Oral)   Resp 14   Wt 194 lb (88 kg)   SpO2 95%   BMI 34.37 kg/m   Wt Readings from Last 3 Encounters:  01/31/16 194 lb (88 kg)  07/05/15 198 lb 9.6 oz (90.1 kg)  03/15/15 192 lb 4.8 oz (87.2 kg)    Physical Exam  Constitutional: She appears well-developed and well-nourished. No distress.  HENT:  Head: Normocephalic and atraumatic.  Eyes: EOM are normal. No scleral icterus.  Neck: No thyromegaly present.  Cardiovascular: Normal rate, regular rhythm and normal heart sounds.   No murmur heard. Pulmonary/Chest: Effort normal and breath sounds normal. No respiratory distress. She has no wheezes.  Abdominal: Soft. Bowel sounds are normal. She exhibits no distension.  Musculoskeletal: Normal range of motion. She exhibits no edema.  Neurological: She is alert. She exhibits normal muscle tone.  Skin: Skin is warm and dry. She is not diaphoretic. No pallor.  Psychiatric: She has a normal mood and affect. Her behavior is normal. Judgment and thought content normal.      Assessment & Plan:    Problem List Items Addressed This Visit      Cardiovascular and Mediastinum   Hypertension goal BP (blood pressure) < 140/90   Relevant Medications   lisinopril (PRINIVIL,ZESTRIL) 10 MG tablet     Musculoskeletal and Integument   Fibromyalgia    Ask psychiatrist to consider Cymbalta; yoga and stretching; good sleep important; increase trazodone to 150 mg  at night but no higher        Other   Prediabetes    Avoid "whites" work on weight loss; return for fasting glucose and A1c      Post traumatic stress disorder (PTSD)    Refer to psychiatrist, encouarged her to get into counseling      Obesity, Class I, BMI 30.0-34.9 (see actual BMI)    See AVS; encouragement for healthy eating      Major depressive disorder, recurrent episode, in partial remission with anxious distress ([place])    Refer to psych      Relevant Medications   traZODone (DESYREL) 100 MG tablet   buPROPion (WELLBUTRIN XL) 300 MG 24 hr tablet   Hypercholesterolemia    Check fasting labs on another day      Relevant Medications   lisinopril (PRINIVIL,ZESTRIL) 10 MG tablet    Other Visit Diagnoses    Depression with anxiety       Relevant Medications   buPROPion (WELLBUTRIN XL) 300 MG 24 hr tablet       Follow up plan: Return in about 6 weeks (around 03/13/2016) for complete physical.  An after-visit summary was printed and given to the patient at [facility].  Please see the patient instructions which may contain other information and recommendations beyond what is mentioned above in the assessment and plan.  Meds ordered this encounter  Medications  . traZODone (DESYREL) 100 MG tablet    Sig: Take 1.5 tablets (150 mg total) by mouth at bedtime as needed. for sleep    Dispense:  45 tablet    Refill:  0  . buPROPion (WELLBUTRIN XL) 300 MG 24 hr tablet    Sig: Take 1 tablet (300 mg total) by mouth daily.    Dispense:  30 tablet    Refill:  0  . lisinopril (PRINIVIL,ZESTRIL) 10 MG tablet    Sig:  Take 1 tablet (10 mg total) by mouth daily.    Dispense:  30 tablet    Refill:  0    No orders of the defined types were placed in this encounter.

## 2016-01-31 NOTE — Assessment & Plan Note (Signed)
Avoid "whites" work on weight loss; return for fasting glucose and A1c

## 2016-01-31 NOTE — Patient Instructions (Addendum)
Please do start to work with a counselor about your anxiety  Return for fasting labs at [facility] in the next week or so (pick up orders here at the front); I will include cotinine  Do not use salt substitutes that contain potassium chloride Okay to use herbs and black pepper  Your goal blood pressure is less than 140 mmHg on top. Try to follow the DASH guidelines (DASH stands for Dietary Approaches to Stop Hypertension) Try to limit the sodium in your diet.  Ideally, consume less than 1.5 grams (less than 1,500mg ) per day. Do not add salt when cooking or at the table.  Check the sodium amount on labels when shopping, and choose items lower in sodium when given a choice. Avoid or limit foods that already contain a lot of sodium. Eat a diet rich in fruits and vegetables and whole grains.  Check out the information at familydoctor.org entitled "Nutrition for Weight Loss: What You Need to Know about Fad Diets" Try to lose between 1-2 pounds per week by taking in fewer calories and burning off more calories You can succeed by limiting portions, limiting foods dense in calories and fat, becoming more active, and drinking 8 glasses of water a day (64 ounces) Don't skip meals, especially breakfast, as skipping meals may alter your metabolism Do not use over-the-counter weight loss pills or gimmicks that claim rapid weight loss A healthy BMI (or body mass index) is between 18.5 and 24.9 You can calculate your ideal BMI at the NIH website ClubMonetize.fr  Try to limit saturated fats in your diet (bologna, hot dogs, barbeque, cheeseburgers, hamburgers, steak, bacon, sausage, cheese, etc.) and get more fresh fruits, vegetables, and whole grains   DASH Eating Plan DASH stands for "Dietary Approaches to Stop Hypertension." The DASH eating plan is a healthy eating plan that has been shown to reduce high blood pressure (hypertension). Additional health  benefits may include reducing the risk of type 2 diabetes mellitus, heart disease, and stroke. The DASH eating plan may also help with weight loss. WHAT DO I NEED TO KNOW ABOUT THE DASH EATING PLAN? For the DASH eating plan, you will follow these general guidelines:  Choose foods with a percent daily value for sodium of less than 5% (as listed on the food label).  Use salt-free seasonings or herbs instead of table salt or sea salt.  Check with your health care provider or pharmacist before using salt substitutes.  Eat lower-sodium products, often labeled as "lower sodium" or "no salt added."  Eat fresh foods.  Eat more vegetables, fruits, and low-fat dairy products.  Choose whole grains. Look for the word "whole" as the first word in the ingredient list.  Choose fish and skinless chicken or [place] more often than red meat. Limit fish, poultry, and meat to 6 oz (170 g) each day.  Limit sweets, desserts, sugars, and sugary drinks.  Choose heart-healthy fats.  Limit cheese to 1 oz (28 g) per day.  Eat more home-cooked food and less restaurant, buffet, and fast food.  Limit fried foods.  Cook foods using methods other than frying.  Limit canned vegetables. If you do use them, rinse them well to decrease the sodium.  When eating at a restaurant, ask that your food be prepared with less salt, or no salt if possible. WHAT FOODS CAN I EAT? Seek help from a dietitian for individual calorie needs. Grains Whole grain or whole wheat bread. Brown rice. Whole grain or whole wheat pasta. Quinoa, bulgur,  and whole grain cereals. Low-sodium cereals. Corn or whole wheat flour tortillas. Whole grain cornbread. Whole grain crackers. Low-sodium crackers. Vegetables Fresh or frozen vegetables (raw, steamed, roasted, or grilled). Low-sodium or reduced-sodium tomato and vegetable juices. Low-sodium or reduced-sodium tomato sauce and paste. Low-sodium or reduced-sodium canned vegetables.  Fruits All  fresh, canned (in natural juice), or frozen fruits. Meat and Other Protein Products Ground beef (85% or leaner), grass-fed beef, or beef trimmed of fat. Skinless chicken or [place]. Ground chicken or [place]. Pork trimmed of fat. All fish and seafood. Eggs. Dried beans, peas, or lentils. Unsalted nuts and seeds. Unsalted canned beans. Dairy Low-fat dairy products, such as skim or 1% milk, 2% or reduced-fat cheeses, low-fat ricotta or cottage cheese, or plain low-fat yogurt. Low-sodium or reduced-sodium cheeses. Fats and Oils Tub margarines without trans fats. Light or reduced-fat mayonnaise and salad dressings (reduced sodium). Avocado. Safflower, olive, or canola oils. Natural peanut or almond butter. Other Unsalted popcorn and pretzels. The items listed above may not be a complete list of recommended foods or beverages. Contact your dietitian for more options. WHAT FOODS ARE NOT RECOMMENDED? Grains White bread. White pasta. White rice. Refined cornbread. Bagels and croissants. Crackers that contain trans fat. Vegetables Creamed or fried vegetables. Vegetables in a cheese sauce. Regular canned vegetables. Regular canned tomato sauce and paste. Regular tomato and vegetable juices. Fruits Dried fruits. Canned fruit in light or heavy syrup. Fruit juice. Meat and Other Protein Products Fatty cuts of meat. Ribs, chicken wings, bacon, sausage, bologna, salami, chitterlings, fatback, hot dogs, bratwurst, and packaged luncheon meats. Salted nuts and seeds. Canned beans with salt. Dairy Whole or 2% milk, cream, half-and-half, and cream cheese. Whole-fat or sweetened yogurt. Full-fat cheeses or blue cheese. Nondairy creamers and whipped toppings. Processed cheese, cheese spreads, or cheese curds. Condiments Onion and garlic salt, seasoned salt, table salt, and sea salt. Canned and packaged gravies. Worcestershire sauce. Tartar sauce. Barbecue sauce. Teriyaki sauce. Soy sauce, including reduced sodium.  Steak sauce. Fish sauce. Oyster sauce. Cocktail sauce. Horseradish. Ketchup and mustard. Meat flavorings and tenderizers. Bouillon cubes. Hot sauce. Tabasco sauce. Marinades. Taco seasonings. Relishes. Fats and Oils Butter, stick margarine, lard, shortening, ghee, and bacon fat. Coconut, palm kernel, or palm oils. Regular salad dressings. Other Pickles and olives. Salted popcorn and pretzels. The items listed above may not be a complete list of foods and beverages to avoid. Contact your dietitian for more information. WHERE CAN I FIND MORE INFORMATION? National Heart, Lung, and Blood Institute: travelstabloid.com   This information is not intended to replace advice given to you by your health care provider. Make sure you discuss any questions you have with your health care provider.   Document Released: 06/11/2011 Document Revised: 07/13/2014 Document Reviewed: 04/26/2013 Elsevier Interactive Patient Education [employer].

## 2016-02-17 ENCOUNTER — Telehealth: Payer: Self-pay | Admitting: Family Medicine

## 2016-02-17 DIAGNOSIS — E7800 Pure hypercholesterolemia, unspecified: Secondary | ICD-10-CM

## 2016-02-17 DIAGNOSIS — Z0000 Encounter for general adult medical examination without abnormal findings: Secondary | ICD-10-CM | POA: Insufficient documentation

## 2016-02-17 DIAGNOSIS — R7303 Prediabetes: Secondary | ICD-10-CM

## 2016-02-17 NOTE — Assessment & Plan Note (Signed)
Lab orders

## 2016-02-17 NOTE — Assessment & Plan Note (Signed)
Check a1c 

## 2016-02-17 NOTE — Telephone Encounter (Signed)
Patient was seen on 01/31/16 and stated that Dr.Lada wanted to have some bloodwork done.  Pt was informed that there was no orders in the system and that provider was on vacation until next week.  Patient stated that she understood and she would call back on next.

## 2016-02-17 NOTE — Assessment & Plan Note (Signed)
Check lipids 

## 2016-02-17 NOTE — Telephone Encounter (Signed)
Thank you I apologize for not putting orders in during her visit I'm entering them now and she can do those any time in the next week or two

## 2016-02-18 ENCOUNTER — Other Ambulatory Visit: Payer: Self-pay

## 2016-02-18 DIAGNOSIS — R7303 Prediabetes: Secondary | ICD-10-CM

## 2016-02-18 DIAGNOSIS — Z0000 Encounter for general adult medical examination without abnormal findings: Secondary | ICD-10-CM

## 2016-02-18 NOTE — Telephone Encounter (Signed)
Labs printed, pt notified

## 2016-02-26 ENCOUNTER — Other Ambulatory Visit: Payer: Self-pay | Admitting: Family Medicine

## 2016-02-27 LAB — COMPREHENSIVE METABOLIC PANEL
ALBUMIN: 3.9 g/dL (ref 3.5–5.5)
ALT: 12 IU/L (ref 0–32)
AST: 15 IU/L (ref 0–40)
Albumin/Globulin Ratio: 1.5 (ref 1.2–2.2)
Alkaline Phosphatase: 66 IU/L (ref 39–117)
BILIRUBIN TOTAL: 0.4 mg/dL (ref 0.0–1.2)
BUN/Creatinine Ratio: 20 (ref 9–23)
BUN: 17 mg/dL (ref 6–24)
CALCIUM: 9.7 mg/dL (ref 8.7–10.2)
CHLORIDE: 102 mmol/L (ref 96–106)
CO2: 26 mmol/L (ref 18–29)
CREATININE: 0.86 mg/dL (ref 0.57–1.00)
GFR calc Af Amer: 89 mL/min/{1.73_m2} (ref >59)
GFR calc non Af Amer: 77 mL/min/{1.73_m2} (ref >59)
GLUCOSE: 100 mg/dL — AB (ref 65–99)
Globulin, Total: 2.6 g/dL (ref 1.5–4.5)
Potassium: 4.6 mmol/L (ref 3.5–5.2)
Sodium: 143 mmol/L (ref 134–144)
TOTAL PROTEIN: 6.5 g/dL (ref 6.0–8.5)

## 2016-02-27 LAB — CBC WITH DIFFERENTIAL/PLATELET
BASOS: 1 %
Basophils Absolute: 0 10*3/uL (ref 0.0–0.2)
EOS (ABSOLUTE): 0.1 10*3/uL (ref 0.0–0.4)
EOS: 2 %
HEMATOCRIT: 36.2 % (ref 34.0–46.6)
HEMOGLOBIN: 11.7 g/dL (ref 11.1–15.9)
Immature Grans (Abs): 0 10*3/uL (ref 0.0–0.1)
Immature Granulocytes: 0 %
LYMPHS ABS: 1.5 10*3/uL (ref 0.7–3.1)
Lymphs: 35 %
MCH: 26.5 pg — AB (ref 26.6–33.0)
MCHC: 32.3 g/dL (ref 31.5–35.7)
MCV: 82 fL (ref 79–97)
MONOCYTES: 5 %
Monocytes Absolute: 0.2 10*3/uL (ref 0.1–0.9)
NEUTROS ABS: 2.4 10*3/uL (ref 1.4–7.0)
Neutrophils: 57 %
Platelets: 202 10*3/uL (ref 150–379)
RBC: 4.41 x10E6/uL (ref 3.77–5.28)
RDW: 15.9 % — ABNORMAL HIGH (ref 12.3–15.4)
WBC: 4.2 10*3/uL (ref 3.4–10.8)

## 2016-02-27 LAB — TSH: TSH: 2.43 u[IU]/mL (ref 0.450–4.500)

## 2016-03-03 ENCOUNTER — Ambulatory Visit: Payer: 59 | Admitting: Family Medicine

## 2016-03-10 ENCOUNTER — Ambulatory Visit (INDEPENDENT_AMBULATORY_CARE_PROVIDER_SITE_OTHER): Payer: 59 | Admitting: Family Medicine

## 2016-03-10 ENCOUNTER — Encounter: Payer: Self-pay | Admitting: Family Medicine

## 2016-03-10 VITALS — BP 140/78 | HR 82 | Temp 98.3°F | Wt 196.0 lb

## 2016-03-10 DIAGNOSIS — R7303 Prediabetes: Secondary | ICD-10-CM | POA: Diagnosis not present

## 2016-03-10 DIAGNOSIS — Z1159 Encounter for screening for other viral diseases: Secondary | ICD-10-CM | POA: Diagnosis not present

## 2016-03-10 DIAGNOSIS — Z92241 Personal history of systemic steroid therapy: Secondary | ICD-10-CM

## 2016-03-10 DIAGNOSIS — E669 Obesity, unspecified: Secondary | ICD-10-CM

## 2016-03-10 DIAGNOSIS — Z0000 Encounter for general adult medical examination without abnormal findings: Secondary | ICD-10-CM | POA: Diagnosis not present

## 2016-03-10 DIAGNOSIS — M25811 Other specified joint disorders, right shoulder: Secondary | ICD-10-CM | POA: Diagnosis not present

## 2016-03-10 DIAGNOSIS — Z114 Encounter for screening for human immunodeficiency virus [HIV]: Secondary | ICD-10-CM

## 2016-03-10 LAB — COMPREHENSIVE METABOLIC PANEL

## 2016-03-10 LAB — LIPID PANEL

## 2016-03-10 LAB — CBC WITH DIFFERENTIAL/PLATELET

## 2016-03-10 LAB — NICOTINE/COTININE METABOLITES

## 2016-03-10 LAB — HEMOGLOBIN A1C

## 2016-03-10 LAB — TSH

## 2016-03-10 MED ORDER — METFORMIN HCL 500 MG PO TABS: 500.0000 mg | ORAL_TABLET | Freq: Two times a day (BID) | ORAL | 2 refills | Status: DC

## 2016-03-10 NOTE — Assessment & Plan Note (Addendum)
Refer to nutritionist; form reviewed with patient; discussed strategy for weight loss, simply taking in fewer calories and spending more calories, with a net decrease of 500 kcal daily to lose 1 pound of fat per week; target BMI addressed and weeks needed to achieve that goal

## 2016-03-10 NOTE — Assessment & Plan Note (Signed)
Discussed one-time hep C screening recommendation for individuals born between 1945-1965 per USPSTF guidelines; patient agrees with testing; Hep C Ab ordered 

## 2016-03-10 NOTE — Assessment & Plan Note (Signed)
Discussed one-time HIV screening recommendation per USPSTF guidelines; patient agrees with testing; HIV antibody ordered 

## 2016-03-10 NOTE — Assessment & Plan Note (Signed)
Check DEXA scan to see if hx of steroids have thinned bones

## 2016-03-10 NOTE — Assessment & Plan Note (Signed)
Start with x-ray

## 2016-03-10 NOTE — Progress Notes (Signed)
Patient ID: Yvette Garza, female   DOB: [birth year], 55 y.o.   MRN: [id number]   Subjective:   Yvette Garza is a 55 y.o. female here for a complete physical exam  USPSTF grade A and B recommendations Alcohol: just socially every once in a while Depression:  Depression screen [facility] 2/9 03/10/2016 01/31/2016 07/05/2015 01/17/2015  Decreased Interest 1 0 1 2  Down, Depressed, Hopeless _0 PHQ - 2 Score _1 Altered sleeping 1 - 3 3  Tired, decreased energy 1 - 3 3  Change in appetite 1 - 0 3  Feeling bad or failure about yourself  0 - 1 0  Trouble concentrating 1 - 3 3  Moving slowly or fidgety/restless 0 - 0 0  Suicidal thoughts 0 - 0 0  PHQ-9 Score 6 - 13 15  Difficult doing work/chores - - - Very difficult  not sleeping well; trazodone is supposed to help but she was sick last week; concentration not new Hypertension: discussed Obesity: yes, brought in appeal form She has tried to walk and taking a more natural approach, trying to ease pain with oils and herbs She is trying to walk 2500 steps a day; walking around the neighborhood three days a week No other exercise because of her neck problems and fibro; difficult to stay motivated; does gardening and household work; does stretches; light resistance; she does not have a set goal; she has not got a goal number, just trying to lose weight; peak weight was 232 pounds Tobacco use: nonsmoker HIV, hep B, hep C: check STD testing and prevention (chl/gon/syphilis): declined Lipids: will take orders another day when fasting Glucose: A1c not done, ordered, will go fasting Colorectal cancer: 2016 Breast cancer: March 2017 BRCA gene screening: no fam hx of breast and/or ovarian cancer Intimate partner violence: no Cervical cancer screening: last pap smear was 02/12/15, negative, no endocervical cells, negative for intraepithelial lesion and malignancy; last doctor wrote that she get a pap smear every 3 years, though; anemic, menstrual  bleeding, NO cancer per patient Lung cancer: non Osteoporosis: never took steroids as a child; has taken as an adult, has taken a few rounds of prednisone and a few injections in the neck; no fam hx  Fall prevention/vitamin D: not taking Diet: she has been avoiding fast food; trying to not eat greasy fried foods, more baked and natural foods; not counting calories; cut down on snacks Exercise: see above Skin cancer: no worrisome moles  HgbA1c 5.9, lipids total 217, TG 245, HDL 41, LDL 127  Past Medical History:  Diagnosis Date  . Allergy   . Anemia    resolved after hysterectomy  . Anxiety   . Arthritis   . Chronic fatigue syndrome   . Depression   . Fibromyalgia   . Heart murmur   . History of shingles   . Hypercholesterolemia 01/31/2016  . Hypertension goal BP (blood pressure) < 140/90 01/17/2015   Previously on ACEI but now weaned off.   . Neuropathy (HCC)    both arms and hands  . Post traumatic stress disorder (PTSD)   . Prediabetes 01/31/2016  . Restless leg syndrome    Past Surgical History:  Procedure Laterality Date  . ABDOMINAL HYSTERECTOMY  2014  . APPENDECTOMY     age 75  . [place] SURGERY  2014  . CESAREAN SECTION     3  . COLONOSCOPY WITH PROPOFOL N/A 03/14/2015   Procedure: COLONOSCOPY WITH PROPOFOL;  Surgeon: Lucilla Lame, MD;  Location: [facility];  Service: Endoscopy;  Laterality: N/A;  . DILATION AND CURETTAGE OF UTERUS    . POLYPECTOMY  03/14/2015   Procedure: POLYPECTOMY;  Surgeon: Lucilla Lame, MD;  Location: [facility];  Service: Endoscopy;;  sigmoid colon polyp, transverse colon polyp  . ROTATOR CUFF REPAIR Right    bone shaved also   Family History  Problem Relation Age of Onset  . Hypertension Mother   . Asthma Sister   . Emphysema Sister   . Arthritis Maternal Grandmother   . Diabetes Maternal Grandmother   . Cataracts Maternal Grandmother   . Heart disease Maternal Grandmother     aortic tear  . Stroke Maternal  Grandmother   . Diabetes Maternal Aunt   . Hyperlipidemia Maternal Aunt   . Breast cancer Neg Hx   grandmother did not have marfan's per patient  Social History  Substance Use Topics  . Smoking status: Never Smoker  . Smokeless tobacco: Never Used  . Alcohol use 0.0 oz/week     Comment: socially   Review of Systems  Objective:   Vitals:   03/10/16 1130  BP: 140/78  Pulse: 82  Temp: 98.3 F (36.8 C)  SpO2: 95%  Weight: 196 lb (88.9 kg)   Body mass index is 34.72 kg/m. Wt Readings from Last 3 Encounters:  03/10/16 196 lb (88.9 kg)  01/31/16 194 lb (88 kg)  07/05/15 198 lb 9.6 oz (90.1 kg)   Physical Exam  Constitutional: She appears well-developed and well-nourished.  HENT:  Head: Normocephalic and atraumatic.  Right Ear: Hearing, tympanic membrane, external ear and ear canal normal.  Left Ear: Hearing, tympanic membrane, external ear and ear canal normal.  Eyes: Conjunctivae and EOM are normal. Right eye exhibits no hordeolum. Left eye exhibits no hordeolum. No scleral icterus.  Neck: Carotid bruit is not present. No thyromegaly present.  Cardiovascular: Normal rate, regular rhythm, S1 normal, S2 normal and normal heart sounds.   No extrasystoles are present.  Pulmonary/Chest: Effort normal and breath sounds normal. No respiratory distress. Right breast exhibits no inverted nipple, no mass, no nipple discharge, no skin change and no tenderness. Left breast exhibits no inverted nipple, no mass, no nipple discharge, no skin change and no tenderness. Breasts are symmetrical.  Abdominal: Soft. Normal appearance and bowel sounds are normal. She exhibits no distension, no abdominal bruit, no pulsatile midline mass and no mass. There is no hepatosplenomegaly. There is no tenderness. No hernia.  Musculoskeletal: Normal range of motion. She exhibits no edema.       Right shoulder: She exhibits swelling (proximal clavicle, nontender, no erythema).  Lymphadenopathy:       Head  (right side): No submandibular adenopathy present.       Head (left side): No submandibular adenopathy present.    She has no cervical adenopathy.    She has no axillary adenopathy.  Neurological: She is alert. She displays no tremor. No cranial nerve deficit. She exhibits normal muscle tone. Gait normal.  Reflex Scores:      Patellar reflexes are 2+ on the right side and 2+ on the left side. Skin: Skin is warm and dry. No bruising and no ecchymosis noted. No cyanosis. No pallor.  Psychiatric: Her speech is normal and behavior is normal. Thought content normal. Her mood appears not anxious. She does not exhibit a depressed mood.   Assessment/Plan:   Problem List Items Addressed This Visit      Musculoskeletal and  Integument   Enlargement of right sternoclavicular joint    Start with xray      Relevant Orders   DG Sternum (Completed)   DG Clavicle Right (Completed)     Other   Preventative health care - Primary    USPSTF grade A and B recommendations reviewed with patient; age-appropriate recommendations, preventive care, screening tests, etc discussed and encouraged; healthy living encouraged; see AVS for patient education given to patient      Prediabetes    Start metformin, to help regular blood sugar but also should help with weight loss efforts; recheck A1c 6 months after last; weight loss is key to slowing or preventing progression to frank diabetes      Obesity, Class I, BMI 30.0-34.9 (see actual BMI)    Refer to nutritionist; form reviewed with patient; discussed strategy for weight loss, simply taking in fewer calories and spending more calories, with a net decrease of 500 kcal daily to lose 1 pound of fat per week; target BMI addressed and weeks needed to achieve that goal      Relevant Medications   metFORMIN (GLUCOPHAGE) 500 MG tablet   Other Relevant Orders   Amb ref to Medical Nutrition Therapy-MNT   Need for hepatitis C screening test    Discussed one-time hep C  screening recommendation for individuals born between 1945-1965 per USPSTF guidelines; patient agrees with testing; Hep C Ab ordered      Relevant Orders   Hepatitis C Antibody (Completed)   Hx of steroid therapy    Check DEXA scan to see if hx of steroids have thinned bones      Relevant Orders   DG Bone Density   Encounter for screening for HIV    Discussed one-time HIV screening recommendation per USPSTF guidelines; patient agrees with testing; HIV antibody ordered      Relevant Orders   HIV antibody (with reflex) (Completed)    Other Visit Diagnoses   None.      Meds ordered this encounter  Medications  . clonazePAM (KLONOPIN) 0.5 MG tablet    Sig: Take 0.5 mg by mouth as needed.  . metFORMIN (GLUCOPHAGE) 500 MG tablet    Sig: Take 1 tablet (500 mg total) by mouth 2 (two) times daily with a meal.    Dispense:  60 tablet    Refill:  2   Orders Placed This Encounter  Procedures  . DG Bone Density    Order Specific Question:   Reason for Exam (SYMPTOM  OR DIAGNOSIS REQUIRED)    Answer:   hx of steroid injections    Order Specific Question:   Preferred imaging location?    Answer:   [facility]    Order Specific Question:   Is the patient pregnant?    Answer:   No  . DG Sternum    Standing Status:   Future    Number of Occurrences:   1    Standing Expiration Date:   05/10/2017    Order Specific Question:   Reason for Exam (SYMPTOM  OR DIAGNOSIS REQUIRED)    Answer:   swelling and deformity over the RIGHT sternoclav joint    Order Specific Question:   Is patient pregnant?    Answer:   No    Order Specific Question:   Preferred imaging location?    Answer:   [facility]  . DG Clavicle Right    Standing Status:   Future    Number of Occurrences:  1    Standing Expiration Date:   05/10/2017    Order Specific Question:   Reason for Exam (SYMPTOM  OR DIAGNOSIS REQUIRED)    Answer:   swelling and deformity of right sternoclavicular joint    Order  Specific Question:   Is patient pregnant?    Answer:   No    Order Specific Question:   Preferred imaging location?    Answer:   [facility]  . HIV antibody (with reflex)  . Hepatitis C Antibody  . Amb ref to Medical Nutrition Therapy-MNT    Referral Priority:   Routine    Referral Type:   Consultation    Referral Reason:   Specialty Services Required    Requested Specialty:   Nutrition    Number of Visits Requested:   1   Did not want flu shot Last tetanus booster within the last 10 years  Follow up plan: Return in about 1 year (around 03/10/2017) for complete physical; 3 months for blood sugar and weight.  An After Visit Summary was printed and given to the patient.

## 2016-03-10 NOTE — Patient Instructions (Addendum)
Please do call to schedule your bone density study; the number to schedule one at either [facility] or [facility] is [phone]  Take 1,000 iu of vitamin D3 once a day  Please have fasting labs done on another day  Check out the information at familydoctor.org entitled "Nutrition for Weight Loss: What You Need to Know about Fad Diets" Try to lose between 1-2 pounds per week by taking in fewer calories and burning off more calories You can succeed by limiting portions, limiting foods dense in calories and fat, becoming more active, and drinking 8 glasses of water a day (64 ounces) Don't skip meals, especially breakfast, as skipping meals may alter your metabolism Do not use over-the-counter weight loss pills or gimmicks that claim rapid weight loss A healthy BMI (or body mass index) is between 18.5 and 24.9 You can calculate your ideal BMI at the [employer] website ClubMonet[employer]fr  Start the metformin to help control blood sugars; take just one pill a day for the first week, then twice a day; best if taken 15 minutes before breakfast and evening meal  I've put in referral to nutritionist

## 2016-03-13 ENCOUNTER — Ambulatory Visit
Admission: RE | Admit: 2016-03-13 | Discharge: 2016-03-13 | Disposition: A | Discharge status: Home / Self Care | Payer: 59 | Source: Ambulatory Visit | Attending: Family Medicine | Admitting: Family Medicine

## 2016-03-13 DIAGNOSIS — M25811 Other specified joint disorders, right shoulder: Secondary | ICD-10-CM

## 2016-03-14 ENCOUNTER — Encounter: Payer: Self-pay | Admitting: Family Medicine

## 2016-03-14 LAB — HIV ANTIBODY (ROUTINE TESTING W REFLEX): HIV SCREEN 4TH GENERATION: NONREACTIVE

## 2016-03-14 LAB — HEPATITIS C ANTIBODY: Hep C Virus Ab: 0.1 s/co ratio (ref 0.0–0.9)

## 2016-03-14 NOTE — Assessment & Plan Note (Signed)
USPSTF grade A and B recommendations reviewed with patient; age-appropriate recommendations, preventive care, screening tests, etc discussed and encouraged; healthy living encouraged; see AVS for patient education given to patient  

## 2016-03-14 NOTE — Assessment & Plan Note (Signed)
Start metformin, to help regular blood sugar but also should help with weight loss efforts; recheck A1c 6 months after last; weight loss is key to slowing or preventing progression to frank diabetes

## 2016-03-17 ENCOUNTER — Telehealth: Payer: Self-pay | Admitting: Family Medicine

## 2016-03-17 DIAGNOSIS — M25811 Other specified joint disorders, right shoulder: Secondary | ICD-10-CM

## 2016-03-17 DIAGNOSIS — R928 Other abnormal and inconclusive findings on diagnostic imaging of breast: Secondary | ICD-10-CM | POA: Insufficient documentation

## 2016-03-17 NOTE — Assessment & Plan Note (Signed)
Order f/u studies From 09/19/15:  Right diagnostic mammogram in 6 months to follow-up upper inner right breast asymmetry, unless biopsy results change management or outside films are obtained and demonstrate long-term stability.

## 2016-03-17 NOTE — Telephone Encounter (Signed)
Thank you I ordered the right diag mammo I ordered the sternal MRI, with note to scheduling that if there is a better test, let me know; there was no specific order for sternoclavicular joint She can call to schedule her own DEXA

## 2016-03-17 NOTE — Assessment & Plan Note (Signed)
Will get MRI

## 2016-03-18 ENCOUNTER — Telehealth: Payer: Self-pay | Admitting: Family Medicine

## 2016-03-18 NOTE — Telephone Encounter (Signed)
mammo sch for Friday, April 03, 2016 @ 11:20am and DEXA sch October 9th @9 :40am  called and left a voicemail stating to call bk for appointment time and date.

## 2016-03-26 ENCOUNTER — Telehealth: Payer: Self-pay | Admitting: Family Medicine

## 2016-03-26 NOTE — Telephone Encounter (Signed)
Pt notified. sch for 03/30/16

## 2016-03-26 NOTE — Telephone Encounter (Signed)
Already ordered

## 2016-03-26 NOTE — Telephone Encounter (Signed)
-----   Message from Arnetha Courser, MD sent at 11/05/2015  9:28 AM EDT ----- Regarding: Right breast diag mamm 04/02/16 Due for 6 month right breast diag mammo 04/02/16

## 2016-03-30 ENCOUNTER — Ambulatory Visit
Admission: RE | Admit: 2016-03-30 | Discharge: 2016-03-30 | Disposition: A | Discharge status: Home / Self Care | Payer: 59 | Source: Ambulatory Visit | Attending: Family Medicine | Admitting: Family Medicine

## 2016-03-30 DIAGNOSIS — M12811 Other specific arthropathies, not elsewhere classified, right shoulder: Secondary | ICD-10-CM | POA: Diagnosis not present

## 2016-03-30 DIAGNOSIS — M25811 Other specified joint disorders, right shoulder: Secondary | ICD-10-CM | POA: Diagnosis not present

## 2016-03-30 DIAGNOSIS — M2548 Effusion, other site: Secondary | ICD-10-CM | POA: Insufficient documentation

## 2016-04-01 ENCOUNTER — Telehealth: Payer: Self-pay | Admitting: Family Medicine

## 2016-04-01 DIAGNOSIS — M25811 Other specified joint disorders, right shoulder: Secondary | ICD-10-CM

## 2016-04-01 NOTE — Telephone Encounter (Signed)
Discussed results of MRI She has never had surgery of the distal clavicle Will refer to ortho Recommended she get a copy on disc to take with her to appt

## 2016-04-01 NOTE — Assessment & Plan Note (Signed)
See abnormal MRI report; refer to orthopaedist; discussed w/patient by phone

## 2016-04-02 ENCOUNTER — Ambulatory Visit: Payer: Commercial Managed Care - HMO | Admitting: Dietician

## 2016-04-03 ENCOUNTER — Ambulatory Visit
Admission: RE | Admit: 2016-04-03 | Discharge: 2016-04-03 | Disposition: A | Discharge status: Home / Self Care | Payer: 59 | Source: Ambulatory Visit | Attending: Family Medicine | Admitting: Family Medicine

## 2016-04-03 ENCOUNTER — Other Ambulatory Visit: Payer: Self-pay | Admitting: Family Medicine

## 2016-04-03 DIAGNOSIS — N63 Unspecified lump in breast: Secondary | ICD-10-CM | POA: Diagnosis not present

## 2016-04-03 DIAGNOSIS — R928 Other abnormal and inconclusive findings on diagnostic imaging of breast: Secondary | ICD-10-CM

## 2016-04-13 ENCOUNTER — Encounter: Payer: Self-pay | Admitting: Family Medicine

## 2016-04-13 ENCOUNTER — Ambulatory Visit
Admission: RE | Admit: 2016-04-13 | Discharge: 2016-04-13 | Disposition: A | Discharge status: Home / Self Care | Payer: 59 | Source: Ambulatory Visit | Attending: Family Medicine | Admitting: Family Medicine

## 2016-04-13 DIAGNOSIS — Z92241 Personal history of systemic steroid therapy: Secondary | ICD-10-CM | POA: Diagnosis present

## 2016-04-13 NOTE — Progress Notes (Signed)
Letter to pt re: dexa

## 2016-04-28 ENCOUNTER — Ambulatory Visit (INDEPENDENT_AMBULATORY_CARE_PROVIDER_SITE_OTHER): Payer: 59 | Admitting: Family Medicine

## 2016-04-28 ENCOUNTER — Encounter: Payer: Self-pay | Admitting: Family Medicine

## 2016-04-28 DIAGNOSIS — J0100 Acute maxillary sinusitis, unspecified: Secondary | ICD-10-CM | POA: Insufficient documentation

## 2016-04-28 DIAGNOSIS — R05 Cough: Secondary | ICD-10-CM | POA: Insufficient documentation

## 2016-04-28 DIAGNOSIS — R059 Cough, unspecified: Secondary | ICD-10-CM | POA: Insufficient documentation

## 2016-04-28 MED ORDER — AZITHROMYCIN 250 MG PO TABS: ORAL_TABLET | ORAL | 0 refills | Status: DC

## 2016-04-28 MED ORDER — BENZONATATE 200 MG PO CAPS: 200.0000 mg | ORAL_CAPSULE | Freq: Two times a day (BID) | ORAL | 0 refills | Status: DC | PRN

## 2016-04-28 NOTE — Progress Notes (Signed)
Name: Yvette Garza   MRN: [id number]    DOB: 1961-04-03   Date:04/28/2016       Progress Note  Subjective  Chief Complaint  Chief Complaint  Patient presents with  . URI    cough, congested, pale green phlegm for 1 week  This patient is usually followed by Dr. Sanda Klein, new to me  Cough  This is a new problem. The current episode started 1 to 4 weeks ago. The cough is productive of sputum. Associated symptoms include chills, headaches, nasal congestion and rhinorrhea (normally has allergies). Pertinent negatives include no chest pain, fever, sore throat or shortness of breath. She has tried OTC cough suppressant (Mucinex and cough drops) for the symptoms.     Past Medical History:  Diagnosis Date  . Allergy   . Anemia    resolved after hysterectomy  . Anxiety   . Arthritis   . Chronic fatigue syndrome   . Depression   . Fibromyalgia   . Heart murmur   . History of shingles   . Hypercholesterolemia 01/31/2016  . Hypertension goal BP (blood pressure) < 140/90 01/17/2015   Previously on ACEI but now weaned off.   . Neuropathy (HCC)    both arms and hands  . Post traumatic stress disorder (PTSD)   . Prediabetes 01/31/2016  . Restless leg syndrome     Past Surgical History:  Procedure Laterality Date  . ABDOMINAL HYSTERECTOMY  2014  . APPENDECTOMY     age 44  . BREAST BIOPSY Right 09/2015   benign  . [place] SURGERY  2014  . CESAREAN SECTION     3  . COLONOSCOPY WITH PROPOFOL N/A 03/14/2015   Procedure: COLONOSCOPY WITH PROPOFOL;  Surgeon: Lucilla Lame, MD;  Location: [facility];  Service: Endoscopy;  Laterality: N/A;  . DILATION AND CURETTAGE OF UTERUS    . POLYPECTOMY  03/14/2015   Procedure: POLYPECTOMY;  Surgeon: Lucilla Lame, MD;  Location: [facility];  Service: Endoscopy;;  sigmoid colon polyp, transverse colon polyp  . ROTATOR CUFF REPAIR Right    bone shaved also    Family History  Problem Relation Age of Onset  . Hypertension Mother   .  Asthma Sister   . Emphysema Sister   . Arthritis Maternal Grandmother   . Diabetes Maternal Grandmother   . Cataracts Maternal Grandmother   . Heart disease Maternal Grandmother     aortic tear  . Stroke Maternal Grandmother   . Diabetes Maternal Aunt   . Hyperlipidemia Maternal Aunt   . Breast cancer Neg Hx     Social History   Social History  . Marital status: Married    Spouse name: N/A  . Number of children: 4  . Years of education: N/A   Occupational History  . Not on file.   Social History Main Topics  . Smoking status: Never Smoker  . Smokeless tobacco: Never Used  . Alcohol use 0.0 oz/week     Comment: socially  . Drug use: No  . Sexual activity: Yes    Partners: Male    Birth control/ protection: None   Other Topics Concern  . Not on file   Social History Narrative  . No narrative on file     Current Outpatient Prescriptions:  .  buPROPion (WELLBUTRIN XL) 300 MG 24 hr tablet, Take 1 tablet (300 mg total) by mouth daily., Disp: 30 tablet, Rfl: 0 .  clonazePAM (KLONOPIN) 0.5 MG tablet, Take 0.5 mg by mouth  as needed., Disp: , Rfl:  .  lisinopril (PRINIVIL,ZESTRIL) 10 MG tablet, Take 1 tablet (10 mg total) by mouth daily., Disp: 30 tablet, Rfl: 0 .  metFORMIN (GLUCOPHAGE) 500 MG tablet, Take 1 tablet (500 mg total) by mouth 2 (two) times daily with a meal., Disp: 60 tablet, Rfl: 2 .  traZODone (DESYREL) 100 MG tablet, Take 1.5 tablets (150 mg total) by mouth at bedtime as needed. for sleep, Disp: 45 tablet, Rfl: 0  No Known Allergies   Review of Systems  Constitutional: Positive for chills. Negative for fever.  HENT: Positive for rhinorrhea (normally has allergies). Negative for sore throat.   Respiratory: Positive for cough. Negative for shortness of breath.   Cardiovascular: Negative for chest pain and palpitations.  Neurological: Positive for headaches.    Objective  Vitals:   04/28/16 1129  BP: 130/80  Pulse: 91  Resp: 16  Temp: 98 F (36.7  C)  TempSrc: Oral  SpO2: 98%  Weight: 203 lb 4.8 oz (92.2 kg)  Height: 5\' 3"  (1.6 m)    Physical Exam  Constitutional: She is oriented to person, place, and time and well-developed, well-nourished, and in no distress.  HENT:  Head: Normocephalic and atraumatic.  Right Ear: Tympanic membrane and ear canal normal.  Left Ear: Tympanic membrane and ear canal normal.  Nose: Right sinus exhibits maxillary sinus tenderness. Left sinus exhibits maxillary sinus tenderness.  Mouth/Throat: No posterior oropharyngeal erythema.  Cardiovascular: Normal rate, regular rhythm, S1 normal, S2 normal and normal heart sounds.   No murmur heard. Pulmonary/Chest: Effort normal and breath sounds normal. She has no wheezes.  Neurological: She is alert and oriented to person, place, and time.  Psychiatric: Mood, memory, affect and judgment normal.  Nursing note and vitals reviewed.    Assessment & Plan  1. Acute non-recurrent maxillary sinusitis Symptoms consistent with acute maxillary sinusitis. Started on antibiotics - azithromycin (ZITHROMAX) 250 MG tablet; 2 tabs po day 1, then 1 tab po q day x 4 days  Dispense: 6 tablet; Refill: 0  2. Cough  - benzonatate (TESSALON) 200 MG capsule; Take 1 capsule (200 mg total) by mouth 2 (two) times daily as needed for cough.  Dispense: 20 capsule; Refill: 0   Poet Hineman Asad A[facility] 04/28/2016 11:35 AM

## 2016-05-06 ENCOUNTER — Ambulatory Visit: Payer: 59 | Admitting: Family Medicine

## 2016-05-20 ENCOUNTER — Other Ambulatory Visit: Payer: Self-pay | Admitting: Family Medicine

## 2016-05-26 ENCOUNTER — Telehealth: Payer: Self-pay

## 2016-05-26 NOTE — Telephone Encounter (Signed)
Patient called stating that she wanted to switch orthopedist because she had a bad experience at [facility].  She wants to get in next week in the afternoons, no earlier than 12 noon except for Monday.  Patient was told that I will send the referral to [facility] and will give her a call back.  I called [facility] and spoke with Mariann Laster and she stated that she needed the notes from [facility] and a copy of the images on CD prior to scheduling.   I called the patient back to inform her but there was no answer. A message was left for her to give me a call back so I could explain everything.

## 2016-05-26 NOTE — Telephone Encounter (Signed)
Patient was informed that she has been scheduled to see Genice Rouge, PA-C with [facility] on 06/02/16 @ 2pm.

## 2016-06-09 ENCOUNTER — Ambulatory Visit: Payer: 59 | Admitting: Family Medicine

## 2016-07-31 ENCOUNTER — Ambulatory Visit: Payer: 59 | Admitting: Family Medicine

## 2016-08-27 ENCOUNTER — Telehealth: Payer: Self-pay | Admitting: Family Medicine

## 2016-08-27 DIAGNOSIS — R928 Other abnormal and inconclusive findings on diagnostic imaging of breast: Secondary | ICD-10-CM

## 2016-08-27 NOTE — Assessment & Plan Note (Signed)
Order iimaging

## 2016-08-27 NOTE — Telephone Encounter (Signed)
-----   Message from Arnetha Courser, MD sent at 04/03/2016  6:24 PM EDT ----- Regarding: additional breast imaging March 2018 RECOMMENDATION: Bilateral diagnostic mammogram and possible right breast ultrasound recommended in March of 2018 to continue to follow the asymmetry in the medial portion of the right breast.

## 2016-08-27 NOTE — Telephone Encounter (Signed)
RECOMMENDATION: Bilateral diagnostic mammogram and possible right breast ultrasound recommended in March of 2018 to continue to follow the asymmetry in the medial portion of the right breast. ------------------------------- Per note Sept 29, 2017 Ordering imaging  Orders Placed This Encounter  Procedures  . MM Digital Diagnostic Bilat    Standing Status:   Future    Standing Expiration Date:   10/25/2017    Order Specific Question:   Reason for Exam (SYMPTOM  OR DIAGNOSIS REQUIRED)    Answer:   abnormal imaging RIGHT breast    Order Specific Question:   Is the patient pregnant?    Answer:   No    Order Specific Question:   Preferred imaging location?    Answer:   [facility]  . US BREAST LTD UNI RIGHT INC AXILLA    Standing Status:   Future    Standing Expiration Date:   10/25/2017    Order Specific Question:   Reason for Exam (SYMPTOM  OR DIAGNOSIS REQUIRED)    Answer:   abnormal imaging RIGHT breast    Order Specific Question:   Preferred imaging location?    Answer:   [facility]

## 2016-09-08 ENCOUNTER — Ambulatory Visit (INDEPENDENT_AMBULATORY_CARE_PROVIDER_SITE_OTHER): Payer: 59 | Admitting: Family Medicine

## 2016-09-08 ENCOUNTER — Encounter: Payer: Self-pay | Admitting: Family Medicine

## 2016-09-08 DIAGNOSIS — I10 Essential (primary) hypertension: Secondary | ICD-10-CM | POA: Diagnosis not present

## 2016-09-08 DIAGNOSIS — E669 Obesity, unspecified: Secondary | ICD-10-CM

## 2016-09-08 DIAGNOSIS — G8929 Other chronic pain: Secondary | ICD-10-CM | POA: Diagnosis not present

## 2016-09-08 DIAGNOSIS — R109 Unspecified abdominal pain: Secondary | ICD-10-CM | POA: Diagnosis not present

## 2016-09-08 DIAGNOSIS — R7303 Prediabetes: Secondary | ICD-10-CM

## 2016-09-08 DIAGNOSIS — M797 Fibromyalgia: Secondary | ICD-10-CM | POA: Diagnosis not present

## 2016-09-08 DIAGNOSIS — F418 Other specified anxiety disorders: Secondary | ICD-10-CM

## 2016-09-08 DIAGNOSIS — Z5181 Encounter for therapeutic drug level monitoring: Secondary | ICD-10-CM

## 2016-09-08 DIAGNOSIS — E7800 Pure hypercholesterolemia, unspecified: Secondary | ICD-10-CM

## 2016-09-08 MED ORDER — METFORMIN HCL 500 MG PO TABS: 500.0000 mg | ORAL_TABLET | Freq: Two times a day (BID) | ORAL | 7 refills | Status: DC

## 2016-09-08 MED ORDER — TRAZODONE HCL 100 MG PO TABS: ORAL_TABLET | ORAL | 7 refills | Status: DC

## 2016-09-08 MED ORDER — BUPROPION HCL ER (XL) 300 MG PO TB24: 300.0000 mg | ORAL_TABLET | Freq: Every day | ORAL | 7 refills | Status: DC

## 2016-09-08 NOTE — Assessment & Plan Note (Signed)
Check fasting lipid panel; patient will return fasting this week or next

## 2016-09-08 NOTE — Assessment & Plan Note (Addendum)
BMI now over 36, nearly 37 with her 12 pounds of weight gain in last 6 months; encouraged weight loss; see AVS; she'll slowly increase her activity level; encouraged water intake

## 2016-09-08 NOTE — Assessment & Plan Note (Signed)
Currently off of ACE-I; will have her continue to work on [place] guidelines and weight loss

## 2016-09-08 NOTE — Patient Instructions (Addendum)
I do recommend yearly flu shots; for individuals who don't want flu shots, try to practice excellent hand hygiene, and avoid nursing homes, day cares, and hospitals during peak flu season; taking additional vitamin C daily during flu/cold season may help boost your immune system too  Check out the information at familydoctor.org entitled "Nutrition for Weight Loss: What You Need to Know about Fad Diets" Try to lose between 1-2 pounds per week by taking in fewer calories and burning off more calories You can succeed by limiting portions, limiting foods dense in calories and fat, becoming more active, and drinking 8 glasses of water a day (64 ounces) Don't skip meals, especially breakfast, as skipping meals may alter your metabolism Do not use over-the-counter weight loss pills or gimmicks that claim rapid weight loss A healthy BMI (or body mass index) is between 18.5 and 24.9 You can calculate your ideal BMI at the Central website [employer]fr  Your goal blood pressure is less than 140 mmHg on top. Try to follow the DASH guidelines (DASH stands for Dietary Approaches to Stop Hypertension) Try to limit the sodium in your diet.  Ideally, consume less than 1.5 grams (less than 1,500mg ) per day. Do not add salt when cooking or at the table.  Check the sodium amount on labels when shopping, and choose items lower in sodium when given a choice. Avoid or limit foods that already contain a lot of sodium. Eat a diet rich in fruits and vegetables and whole grains. Return for fasting labs this week or next   Steps to Elicit the Relaxation Response The following is the technique reprinted with permission from Dr. Billie Ruddy book The Relaxation Response pages 162-163 1. Sit quietly in a comfortable position. 2. Close your eyes. 3. Deeply relax all your muscles,  beginning at your feet and progressing up to your face.  Keep them relaxed. 4. Breathe  through your nose.  Become aware of your breathing.  As you breathe out, say the word, "one"*,  silently to yourself. For example,  breathe in ... out, "one",- in .. out, "one", etc.  Breathe easily and naturally. 5. Continue for 10 to 20 minutes.  You may open your eyes to check the time, but do not use an alarm.  When you finish, sit quietly for several minutes,  at first with your eyes closed and later with your eyes opened.  Do not stand up for a few minutes. 6. Do not worry about whether you are successful  in achieving a deep level of relaxation.  Maintain a passive attitude and permit relaxation to occur at its own pace.  When distracting thoughts occur,  try to ignore them by not dwelling upon them  and return to repeating "one."  With practice, the response should come with little effort.  Practice the technique once or twice daily,  but not within two hours after any meal,  since the digestive processes seem to interfere with  the elicitation of the Relaxation Response. * It is better to use a soothing, mellifluous sound, preferably with no meaning. or association, to avoid stimulation of unnecessary thoughts - a mantra.

## 2016-09-08 NOTE — Assessment & Plan Note (Signed)
Check Cr on the metformin

## 2016-09-08 NOTE — Assessment & Plan Note (Signed)
Work on weight loss; continue metformin; return for fasting labs (glucose, A1c, Cr)

## 2016-09-08 NOTE — Progress Notes (Signed)
BP 134/76   Pulse 97   Temp 98.3 F (36.8 C) (Oral)   Wt 208 lb 8 oz (94.6 kg)   SpO2 95%   BMI 36.93 kg/m    Subjective:    Patient ID: Yvette Garza, female    DOB: Aug 23, 1960, 56 y.o.   MRN: [id number]  HPI: Yvette Garza is a 56 y.o. female  Chief Complaint  Patient presents with  . Medication Refill  . Abdominal Pain    LUQ   I saw the patient on January 31, 2016; note reviewed We discussed depression, anxiety, fibromyalgia, HTN, prediabetes She also had a physical on Sept 5, 2017  Since last visit, she "had a cold or something" but is better now; she did not get a flu shot and does not want one  She wanted to f/u about her weight; she thinks she'd be better after Spring when she can be more active; she can't deal with the cold weather; she is from [place] She has been "doing good" and not eating fried foods and not eating fast foods Watching portions; not drinking enough water she admits She is drinking parsley tea which helps with detox and inflammation and cancer fighting; putting basil in her salads No thyroid dz personally; sister had hypothyroid problems and had it removed, takes medicine Lab Results  Component Value Date   TSH 2.430 02/26/2016  She will go back to walking in the Spring  She has been having pain on the left side of the abdomen; not sure what body part is there; since drinking parsley tea, she thinks it is flushing her kidneys... No, liver; she says is it easing the discomfort; not a pain really, more of a discomfort; periodic; not every day; over last few months; no change in the bowel habits; last colonoscopy UTD; last 2016, next 5 years later; no blood in stool; no heartburn or reflux; no sorreness when bending; no cough now  Needs refills of her medicines Using trazodone for sleep for her fibromyalgia; she has gotten a job which gets her out of the house  Prediabetes; on metformin; forgets sometimes  Not taking lisinopril for HTN;  controlled today  She was getting clonazepam from previous doctor; she has done cognitive behavior therapy in the past, mind just goes; just trouble getting sleep; has a few left, maybe four left, just takes them when only absolutely necessary  Depression screen [facility] 2/9 09/08/2016 03/10/2016 01/31/2016 07/05/2015 01/17/2015  Decreased Interest 0 1 0 1 2  Down, Depressed, Hopeless 0 1 1 2 1   PHQ - 2 Score 0 2 1 3 3   Altered sleeping - 1 - 3 3  Tired, decreased energy - 1 - 3 3  Change in appetite - 1 - 0 3  Feeling bad or failure about yourself  - 0 - 1 0  Trouble concentrating - 1 - 3 3  Moving slowly or fidgety/restless - 0 - 0 0  Suicidal thoughts - 0 - 0 0  PHQ-9 Score - 6 - 13 15  Difficult doing work/chores - - - - Very difficult   Relevant past medical, surgical, family and social history reviewed Past Medical History:  Diagnosis Date  . Allergy   . Anemia    resolved after hysterectomy  . Anxiety   . Arthritis   . Chronic fatigue syndrome   . Depression   . Fibromyalgia   . Heart murmur   . History of shingles   . Hypercholesterolemia 01/31/2016  .  Hypertension goal BP (blood pressure) < 140/90 01/17/2015   Previously on ACEI but now weaned off.   . Neuropathy (HCC)    both arms and hands  . Post traumatic stress disorder (PTSD)   . Prediabetes 01/31/2016  . Restless leg syndrome    Past Surgical History:  Procedure Laterality Date  . ABDOMINAL HYSTERECTOMY  2014  . APPENDECTOMY     age 89  . BREAST BIOPSY Right 09/2015   benign  . [place] SURGERY  2014  . CESAREAN SECTION     3  . COLONOSCOPY WITH PROPOFOL N/A 03/14/2015   Procedure: COLONOSCOPY WITH PROPOFOL;  Surgeon: Lucilla Lame, MD;  Location: [place];  Service: Endoscopy;  Laterality: N/A;  . DILATION AND CURETTAGE OF UTERUS    . POLYPECTOMY  03/14/2015   Procedure: POLYPECTOMY;  Surgeon: Lucilla Lame, MD;  Location: [place];  Service: Endoscopy;;  sigmoid colon polyp, transverse  colon polyp  . ROTATOR CUFF REPAIR Right    bone shaved also   Family History  Problem Relation Age of Onset  . Hypertension Mother   . Asthma Sister   . Emphysema Sister   . Arthritis Maternal Grandmother   . Diabetes Maternal Grandmother   . Cataracts Maternal Grandmother   . Heart disease Maternal Grandmother     aortic tear  . Stroke Maternal Grandmother   . Diabetes Maternal Aunt   . Hyperlipidemia Maternal Aunt   . Breast cancer Neg Hx    Social History  Substance Use Topics  . Smoking status: Never Smoker  . Smokeless tobacco: Never Used  . Alcohol use 0.0 oz/week     Comment: socially    Interim medical history since last visit reviewed. Allergies and medications reviewed  Review of Systems Per HPI unless specifically indicated above     Objective:    BP 134/76   Pulse 97   Temp 98.3 F (36.8 C) (Oral)   Wt 208 lb 8 oz (94.6 kg)   SpO2 95%   BMI 36.93 kg/m   Wt Readings from Last 3 Encounters:  09/08/16 208 lb 8 oz (94.6 kg)  04/28/16 203 lb 4.8 oz (92.2 kg)  03/10/16 196 lb (88.9 kg)    Physical Exam  Constitutional: She appears well-developed and well-nourished. No distress.  HENT:  Head: Normocephalic and atraumatic.  Eyes: EOM are normal. No scleral icterus.  Neck: No thyromegaly present.  Cardiovascular: Normal rate, regular rhythm and normal heart sounds.   No murmur heard. Pulmonary/Chest: Effort normal and breath sounds normal. No respiratory distress. She has no wheezes.  Abdominal: Soft. Bowel sounds are normal. She exhibits no distension and no mass. There is no tenderness. There is no guarding.  Musculoskeletal: Normal range of motion. She exhibits no edema.  Neurological: She is alert. She exhibits normal muscle tone.  Skin: Skin is warm and dry. She is not diaphoretic. No pallor.  Psychiatric: She has a normal mood and affect. Her behavior is normal. Judgment and thought content normal.   Results for orders placed or performed in  visit on 03/10/16  HIV antibody (with reflex)  Result Value Ref Range   HIV Screen 4th Generation wRfx Non Reactive Non Reactive  Hepatitis C Antibody  Result Value Ref Range   Hep C Virus Ab 0.1 0.0 - 0.9 s/co ratio      Assessment & Plan:   Problem List Items Addressed This Visit      Cardiovascular and Mediastinum   Hypertension  goal BP (blood pressure) < 140/90    Currently off of ACE-I; will have her continue to work on DASH guidelines and weight loss        Other   Prediabetes    Work on weight loss; continue metformin; return for fasting labs (glucose, A1c, Cr)      Relevant Orders   Hemoglobin A1c   Obesity (BMI 35.0-39.9 without comorbidity)    BMI now over 36, nearly 37 with her 12 pounds of weight gain in last 6 months; encouraged weight loss; see AVS; she'll slowly increase her activity level; encouraged water intake      Relevant Medications   metFORMIN (GLUCOPHAGE) 500 MG tablet   Medication monitoring encounter    Check Cr on the metformin      Relevant Orders   COMPLETE METABOLIC PANEL WITH GFR   Left flank pain, chronic    Check urine; check stool cards to check for occult blood from either stomach or the colon; does not sound musculoskeletal; wait on urine and stool cards and then consider scan if not improving      Relevant Orders   CBC with Differential/Platelet   Urinalysis w microscopic + reflex cultur   COMPLETE METABOLIC PANEL WITH GFR   Hypercholesterolemia    Check fasting lipid panel; patient will return fasting this week or next      Relevant Orders   Lipid panel   Fibromyalgia    Encouraged regular activity, with slow increase upward in minutes; weight loss encouraged too; trazodone to help with sleep       Other Visit Diagnoses    Depression with anxiety       Relevant Medications   buPROPion (WELLBUTRIN XL) 300 MG 24 hr tablet    discouraged benzo; explained alcohol in pill form essentially, encouraged use of relaxation  response   Follow up plan: No Follow-up on file.  An after-visit summary was printed and given to the patient at [facility].  Please see the patient instructions which may contain other information and recommendations beyond what is mentioned above in the assessment and plan.  Meds ordered this encounter  Medications  . traZODone (DESYREL) 100 MG tablet    Sig: TAKE 1 AND 1/2 TABLETS(150 MG) BY MOUTH AT BEDTIME AS NEEDED FOR SLEEP    Dispense:  45 tablet    Refill:  7  . metFORMIN (GLUCOPHAGE) 500 MG tablet    Sig: Take 1 tablet (500 mg total) by mouth 2 (two) times daily with a meal.    Dispense:  60 tablet    Refill:  7  . buPROPion (WELLBUTRIN XL) 300 MG 24 hr tablet    Sig: Take 1 tablet (300 mg total) by mouth daily.    Dispense:  30 tablet    Refill:  7    Orders Placed This Encounter  Procedures  . Hemoglobin A1c  . Lipid panel  . CBC with Differential/Platelet  . Urinalysis w microscopic + reflex cultur  . COMPLETE METABOLIC PANEL WITH GFR  Orders printed, up front in folder Stool cards given to patient at appointment

## 2016-09-08 NOTE — Assessment & Plan Note (Signed)
Check urine; check stool cards to check for occult blood from either stomach or the colon; does not sound musculoskeletal; wait on urine and stool cards and then consider scan if not improving

## 2016-09-08 NOTE — Assessment & Plan Note (Signed)
Encouraged regular activity, with slow increase upward in minutes; weight loss encouraged too; trazodone to help with sleep

## 2016-09-18 ENCOUNTER — Other Ambulatory Visit: Payer: Self-pay | Admitting: Family Medicine

## 2016-09-18 ENCOUNTER — Ambulatory Visit
Admission: RE | Admit: 2016-09-18 | Discharge: 2016-09-18 | Disposition: A | Discharge status: Home / Self Care | Payer: 59 | Source: Ambulatory Visit | Attending: Family Medicine | Admitting: Family Medicine

## 2016-09-18 ENCOUNTER — Ambulatory Visit: Payer: 59 | Admitting: Family Medicine

## 2016-09-18 DIAGNOSIS — N6489 Other specified disorders of breast: Secondary | ICD-10-CM | POA: Insufficient documentation

## 2016-09-18 DIAGNOSIS — R928 Other abnormal and inconclusive findings on diagnostic imaging of breast: Secondary | ICD-10-CM

## 2016-09-22 ENCOUNTER — Ambulatory Visit: Payer: 59 | Admitting: Family Medicine

## 2016-12-25 ENCOUNTER — Telehealth: Payer: Self-pay | Admitting: Family Medicine

## 2016-12-25 NOTE — Telephone Encounter (Signed)
Left message asking patient to please schedule an appt to come in; nothing seriously wrong on labs so please don't lose a minute of sleep Just want to help her with cholesterol and go over a few other little things Please call office to schedule an appt for July 3rd or just after ------------------------- H/H 12.3/37.4 (normal) but MCV 80, MCH 26.5, RDW 15.9 Glucose 106, A1c 5.7 Total chol 220, TG 348, HDL 35, LDL 115

## 2016-12-31 ENCOUNTER — Encounter: Payer: Self-pay | Admitting: Family Medicine

## 2017-01-21 ENCOUNTER — Ambulatory Visit: Payer: 59 | Admitting: Family Medicine

## 2017-01-22 ENCOUNTER — Encounter: Payer: Self-pay | Admitting: Family Medicine

## 2017-01-22 ENCOUNTER — Ambulatory Visit (INDEPENDENT_AMBULATORY_CARE_PROVIDER_SITE_OTHER): Payer: 59 | Admitting: Family Medicine

## 2017-01-22 ENCOUNTER — Other Ambulatory Visit: Payer: Self-pay | Admitting: Family Medicine

## 2017-01-22 DIAGNOSIS — F3341 Major depressive disorder, recurrent, in partial remission: Secondary | ICD-10-CM | POA: Diagnosis not present

## 2017-01-22 DIAGNOSIS — E7800 Pure hypercholesterolemia, unspecified: Secondary | ICD-10-CM

## 2017-01-22 DIAGNOSIS — M797 Fibromyalgia: Secondary | ICD-10-CM

## 2017-01-22 DIAGNOSIS — E669 Obesity, unspecified: Secondary | ICD-10-CM | POA: Diagnosis not present

## 2017-01-22 DIAGNOSIS — I10 Essential (primary) hypertension: Secondary | ICD-10-CM | POA: Diagnosis not present

## 2017-01-22 DIAGNOSIS — R7303 Prediabetes: Secondary | ICD-10-CM

## 2017-01-22 DIAGNOSIS — F418 Other specified anxiety disorders: Secondary | ICD-10-CM | POA: Diagnosis not present

## 2017-01-22 MED ORDER — FLUTICASONE PROPIONATE 50 MCG/ACT NA SUSP: 2.0000 | Freq: Every day | NASAL | 3 refills | Status: DC | PRN

## 2017-01-22 MED ORDER — BUPROPION HCL ER (XL) 300 MG PO TB24: 300.0000 mg | ORAL_TABLET | Freq: Every day | ORAL | 1 refills | Status: DC

## 2017-01-22 MED ORDER — METFORMIN HCL 500 MG PO TABS: 500.0000 mg | ORAL_TABLET | Freq: Two times a day (BID) | ORAL | 1 refills | Status: DC

## 2017-01-22 MED ORDER — GABAPENTIN 300 MG PO CAPS: ORAL_CAPSULE | ORAL | 0 refills | Status: DC

## 2017-01-22 MED ORDER — TRAZODONE HCL 100 MG PO TABS: ORAL_TABLET | ORAL | 1 refills | Status: DC

## 2017-01-22 NOTE — Progress Notes (Signed)
BP 118/76   Pulse 91   Temp 97.9 F (36.6 C) (Oral)   Resp 14   Ht 5\' 3"  (1.6 m)   Wt 217 lb 6.4 oz (98.6 kg)   SpO2 96%   BMI 38.51 kg/m    Subjective:    Patient ID: Yvette Garza, female    DOB: Jan 30, 1961, 56 y.o.   MRN: [id number]  HPI: Yvette Garza is a 56 y.o. female  Chief Complaint  Patient presents with  . Follow-up    HPI  She is here for f/u; see last phone note; some abnormal labs and here to discuss Gained a few pounds on vacation -------------------------------------------- Left message asking patient to please schedule an appt to come in; nothing seriously wrong on labs so please don't lose a minute of sleep Just want to help her with cholesterol and go over a few other little things Please call office to schedule an appt for July 3rd or just after ------------------------- H/H 12.3/37.4 (normal) but MCV 80, MCH 26.5, RDW 15.9 Glucose 106, A1c 5.7 Total chol 220, TG 348, HDL 35, LDL 115 ------------------------------------------------  Prediabetes; some dry mouth; not really any frequent urination; no blurred vision, just wears readers She knows to avoid fried foods and greasy, starches; loves bread and rice; getting better she says; does half white and half brown rice No sugary drinks; stopped drinking more than one soda a week; corn silk tea She filled the metformin but couldn't remember if she should have taken it; not sure if okay to take with others  Already takes the wellbutrin and trazodone; needs Rxs to go to mail order  Used to take gabapentin for chronic pain and fibromyalgia; wants to take it again  High TG, HDL too low on last labs, discussed  Very mildly low MCV; years of anemia, had hysterectomy; probably not enough iron in her diet she admits  Trazodone working well for sleep; some nights are hard  Depression screen [facility] 2/9 01/22/2017 09/08/2016 03/10/2016 01/31/2016 07/05/2015  Decreased Interest 0 0 1 0 1  Down, Depressed, Hopeless 0  0 1 1 2   PHQ - 2 Score 0 0 2 1 3   Altered sleeping - - 1 - 3  Tired, decreased energy - - 1 - 3  Change in appetite - - 1 - 0  Feeling bad or failure about yourself  - - 0 - 1  Trouble concentrating - - 1 - 3  Moving slowly or fidgety/restless - - 0 - 0  Suicidal thoughts - - 0 - 0  PHQ-9 Score - - 6 - 13  Difficult doing work/chores - - - - -   Relevant past medical, surgical, family and social history reviewed Past Medical History:  Diagnosis Date  . Allergy   . Anemia    resolved after hysterectomy  . Anxiety   . Arthritis   . Chronic fatigue syndrome   . Depression   . Fibromyalgia   . Heart murmur   . History of shingles   . Hypercholesterolemia 01/31/2016  . Hypertension goal BP (blood pressure) < 140/90 01/17/2015   Previously on ACEI but now weaned off.   . Neuropathy    both arms and hands  . Post traumatic stress disorder (PTSD)   . Prediabetes 01/31/2016  . Restless leg syndrome    Past Surgical History:  Procedure Laterality Date  . ABDOMINAL HYSTERECTOMY  2014  . APPENDECTOMY     age 56  . BREAST BIOPSY Right 09/2015  benign  . [place] SURGERY  2014  . CESAREAN SECTION     3  . COLONOSCOPY WITH PROPOFOL N/A 03/14/2015   Procedure: COLONOSCOPY WITH PROPOFOL;  Surgeon: Lucilla Lame, MD;  Location: [place];  Service: Endoscopy;  Laterality: N/A;  . DILATION AND CURETTAGE OF UTERUS    . POLYPECTOMY  03/14/2015   Procedure: POLYPECTOMY;  Surgeon: Lucilla Lame, MD;  Location: [place];  Service: Endoscopy;;  sigmoid colon polyp, transverse colon polyp  . ROTATOR CUFF REPAIR Right    bone shaved also   Family History  Problem Relation Age of Onset  . Hypertension Mother   . Asthma Sister   . Emphysema Sister   . Arthritis Maternal Grandmother   . Diabetes Maternal Grandmother   . Cataracts Maternal Grandmother   . Heart disease Maternal Grandmother        aortic tear  . Stroke Maternal Grandmother   . Diabetes Maternal Aunt     . Hyperlipidemia Maternal Aunt   . Breast cancer Neg Hx    Social History   Social History  . Marital status: Married    Spouse name: N/A  . Number of children: 4  . Years of education: N/A   Occupational History  . Not on file.   Social History Main Topics  . Smoking status: Never Smoker  . Smokeless tobacco: Never Used  . Alcohol use 0.0 oz/week     Comment: socially  . Drug use: No  . Sexual activity: Yes    Partners: Male    Birth control/ protection: None   Other Topics Concern  . Not on file   Social History Narrative  . No narrative on file   Interim medical history since last visit reviewed. Allergies and medications reviewed  Review of Systems Per HPI unless specifically indicated above     Objective:    BP 118/76   Pulse 91   Temp 97.9 F (36.6 C) (Oral)   Resp 14   Ht 5\' 3"  (1.6 m)   Wt 217 lb 6.4 oz (98.6 kg)   SpO2 96%   BMI 38.51 kg/m   Wt Readings from Last 3 Encounters:  01/22/17 217 lb 6.4 oz (98.6 kg)  09/08/16 208 lb 8 oz (94.6 kg)  04/28/16 203 lb 4.8 oz (92.2 kg)    Physical Exam  Constitutional: She appears well-developed and well-nourished. No distress.  Obese, weight up almost 9 pounds over last 4-1/2 months  HENT:  Head: Normocephalic and atraumatic.  Eyes: EOM are normal. No scleral icterus.  Neck: No thyromegaly present.  Cardiovascular: Normal rate, regular rhythm and normal heart sounds.   Pulmonary/Chest: Effort normal and breath sounds normal. She has no wheezes.  Abdominal: Soft. She exhibits no distension.  Musculoskeletal: She exhibits no edema.  Neurological: She is alert.  Skin: Skin is warm and dry. She is not diaphoretic. No pallor.  Psychiatric: She has a normal mood and affect. Her behavior is normal. Judgment and thought content normal.    Results for orders placed or performed in visit on 03/10/16  HIV antibody (with reflex)  Result Value Ref Range   HIV Screen 4th Generation wRfx Non Reactive Non  Reactive  Hepatitis C Antibody  Result Value Ref Range   Hep C Virus Ab 0.1 0.0 - 0.9 s/co ratio      Assessment & Plan:   Problem List Items Addressed This Visit      Cardiovascular and Mediastinum   Hypertension  goal BP (blood pressure) < 140/90    Controlled today        Other   Prediabetes    Encouraged patient to work on weight loss; start back on metformin, which should help control glucose but may also help with weight loss      Obesity (BMI 35.0-39.9 without comorbidity)    Encouraged weight loss      Major depressive disorder, recurrent episode, in partial remission with anxious distress ([place])    Continue medicine; mail order Rx for wellbutrin to [facility]      Relevant Medications   traZODone (DESYREL) 100 MG tablet   buPROPion (WELLBUTRIN XL) 300 MG 24 hr tablet   Hypercholesterolemia    Reviewed lab results with patient; discussed dietary strategies for lowering TG, lower LDL, and weight loss for raising HDL; she will give this a few months of TLC and then return for fasting labs      Fibromyalgia    Start back on gabapentin at her request; one month sent to local pharmacy; she may call for 3 month supply to go to mail order in a few weeks if working well       Other Visit Diagnoses    Depression with anxiety       Relevant Medications   buPROPion (WELLBUTRIN XL) 300 MG 24 hr tablet       Follow up plan: Return in about 3 months (around 04/24/2017) for fasting labs and I'll call with results.  An after-visit summary was printed and given to the patient at [facility].  Please see the patient instructions which may contain other information and recommendations beyond what is mentioned above in the assessment and plan.  Meds ordered this encounter  Medications  . DISCONTD: fluticasone (FLONASE) 50 MCG/ACT nasal spray    Sig: Place 2 sprays into both nostrils daily as needed for allergies or rhinitis.  . fexofenadine (ALLEGRA ALLERGY) 60 MG tablet    Sig:  Take 60 mg by mouth daily as needed for allergies or rhinitis.  Marland Kitchen gabapentin (NEURONTIN) 300 MG capsule    Sig: One pill by mouth at night for three nights, then one pill twice a day    Dispense:  60 capsule    Refill:  0  . traZODone (DESYREL) 100 MG tablet    Sig: TAKE 1 AND 1/2 TABLETS(150 MG) BY MOUTH AT BEDTIME AS NEEDED FOR SLEEP    Dispense:  135 tablet    Refill:  1  . buPROPion (WELLBUTRIN XL) 300 MG 24 hr tablet    Sig: Take 1 tablet (300 mg total) by mouth daily.    Dispense:  90 tablet    Refill:  1  . fluticasone (FLONASE) 50 MCG/ACT nasal spray    Sig: Place 2 sprays into both nostrils daily as needed for allergies or rhinitis.    Dispense:  48 g    Refill:  3   No orders of the defined types were placed in this encounter.

## 2017-01-22 NOTE — Assessment & Plan Note (Signed)
Encouraged patient to work on weight loss; start back on metformin, which should help control glucose but may also help with weight loss

## 2017-01-22 NOTE — Assessment & Plan Note (Signed)
Start back on gabapentin at her request; one month sent to local pharmacy; she may call for 3 month supply to go to mail order in a few weeks if working well

## 2017-01-22 NOTE — Patient Instructions (Addendum)
Avoid "whites" Try to limit saturated fats in your diet (bologna, hot dogs, barbeque, cheeseburgers, hamburgers, steak, bacon, sausage, cheese, etc.) and get more fresh fruits, vegetables, and whole grains Try to add more greens to your diet, and lentils are also a good source of iron Consider krill oil while working on your diet and weight  Check out the information at familydoctor.org entitled "Nutrition for Weight Loss: What You Need to Know about Fad Diets" Try to lose between 1-2 pounds per week by taking in fewer calories and burning off more calories You can succeed by limiting portions, limiting foods dense in calories and fat, becoming more active, and drinking 8 glasses of water a day (64 ounces) Don't skip meals, especially breakfast, as skipping meals may alter your metabolism Do not use over-the-counter weight loss pills or gimmicks that claim rapid weight loss A healthy BMI (or body mass index) is between 18.5 and 24.9 You can calculate your ideal BMI at the [place] website ClubMonetize.fr   Food Choices to Lower Your Triglycerides Triglycerides are a type of fat in your blood. High levels of triglycerides can increase the risk of heart disease and stroke. If your triglyceride levels are high, the foods you eat and your eating habits are very important. Choosing the right foods can help lower your triglycerides. What general guidelines do I need to follow?  Lose weight if you are overweight.  Limit or avoid alcohol.  Fill one half of your plate with vegetables and green salads.  Limit fruit to two servings a day. Choose fruit instead of juice.  Make one fourth of your plate whole grains. Look for the word "whole" as the first word in the ingredient list.  Fill one fourth of your plate with lean protein foods.  Enjoy fatty fish (such as salmon, mackerel, sardines, and tuna) three times a week.  Choose healthy fats.  Limit  foods high in starch and sugar.  Eat more home-cooked food and less restaurant, buffet, and fast food.  Limit fried foods.  Cook foods using methods other than frying.  Limit saturated fats.  Check ingredient lists to avoid foods with partially hydrogenated oils (trans fats) in them. What foods can I eat? Grains Whole grains, such as whole wheat or whole grain breads, crackers, cereals, and pasta. Unsweetened oatmeal, bulgur, barley, quinoa, or brown rice. Corn or whole wheat flour tortillas. Vegetables Fresh or frozen vegetables (raw, steamed, roasted, or grilled). Green salads. Fruits All fresh, canned (in natural juice), or frozen fruits. Meat and Other Protein Products Ground beef (85% or leaner), grass-fed beef, or beef trimmed of fat. Skinless chicken or [place]. Ground chicken or [place]. Pork trimmed of fat. All fish and seafood. Eggs. Dried beans, peas, or lentils. Unsalted nuts or seeds. Unsalted canned or dry beans. Dairy Low-fat dairy products, such as skim or 1% milk, 2% or reduced-fat cheeses, low-fat ricotta or cottage cheese, or plain low-fat yogurt. Fats and Oils Tub margarines without trans fats. Light or reduced-fat mayonnaise and salad dressings. Avocado. Safflower, olive, or canola oils. Natural peanut or almond butter. The items listed above may not be a complete list of recommended foods or beverages. Contact your dietitian for more options. What foods are not recommended? Grains White bread. White pasta. White rice. Cornbread. Bagels, pastries, and croissants. Crackers that contain trans fat. Vegetables White potatoes. Corn. Creamed or fried vegetables. Vegetables in a cheese sauce. Fruits Dried fruits. Canned fruit in light or heavy syrup. Fruit juice. Meat and Other Protein  Products Fatty cuts of meat. Ribs, chicken wings, bacon, sausage, bologna, salami, chitterlings, fatback, hot dogs, bratwurst, and packaged luncheon meats. Dairy Whole or 2% milk, cream,  half-and-half, and cream cheese. Whole-fat or sweetened yogurt. Full-fat cheeses. Nondairy creamers and whipped toppings. Processed cheese, cheese spreads, or cheese curds. Sweets and Desserts Corn syrup, sugars, honey, and molasses. Candy. Jam and jelly. Syrup. Sweetened cereals. Cookies, pies, cakes, donuts, muffins, and ice cream. Fats and Oils Butter, stick margarine, lard, shortening, ghee, or bacon fat. Coconut, palm kernel, or palm oils. Beverages Alcohol. Sweetened drinks (such as sodas, lemonade, and fruit drinks or punches). The items listed above may not be a complete list of foods and beverages to avoid. Contact your dietitian for more information. This information is not intended to replace advice given to you by your health care provider. Make sure you discuss any questions you have with your health care provider. Document Released: 04/09/2004 Document Revised: 11/28/2015 Document Reviewed: 04/26/2013 Elsevier Interactive Patient Education  2017 [employer].

## 2017-01-22 NOTE — Assessment & Plan Note (Signed)
Controlled today 

## 2017-01-22 NOTE — Assessment & Plan Note (Signed)
Encouraged weight loss 

## 2017-01-22 NOTE — Assessment & Plan Note (Signed)
Reviewed lab results with patient; discussed dietary strategies for lowering TG, lower LDL, and weight loss for raising HDL; she will give this a few months of TLC and then return for fasting labs

## 2017-01-22 NOTE — Assessment & Plan Note (Signed)
Continue medicine; mail order Rx for wellbutrin to [facility]

## 2017-01-22 NOTE — Progress Notes (Signed)
Metformin was removed from med list, reason given by CMA was "error" The metformin was exactly how I wanted it; no error on my part She just wasn't taking it I am re-activating the medicine and sent new Rx to mail order

## 2017-02-19 ENCOUNTER — Other Ambulatory Visit: Payer: Self-pay | Admitting: Family Medicine

## 2017-02-19 LAB — BASIC METABOLIC PANEL: GLUCOSE: 99

## 2017-02-19 LAB — HEMOGLOBIN A1C: Hemoglobin A1C: 5.7

## 2017-02-19 LAB — LIPID PANEL
HDL: 39 (ref 35–70)
LDL CALC: 130
Triglycerides: 276 — AB (ref 40–160)

## 2017-04-20 ENCOUNTER — Encounter: Payer: Self-pay | Admitting: Family Medicine

## 2017-04-20 ENCOUNTER — Ambulatory Visit (INDEPENDENT_AMBULATORY_CARE_PROVIDER_SITE_OTHER): Payer: 59 | Admitting: Family Medicine

## 2017-04-20 VITALS — BP 130/78 | HR 90 | Temp 98.0°F | Ht 63.0 in | Wt 216.9 lb

## 2017-04-20 DIAGNOSIS — E669 Obesity, unspecified: Secondary | ICD-10-CM

## 2017-04-20 DIAGNOSIS — N6320 Unspecified lump in the left breast, unspecified quadrant: Secondary | ICD-10-CM

## 2017-04-20 DIAGNOSIS — R102 Pelvic and perineal pain: Secondary | ICD-10-CM | POA: Diagnosis not present

## 2017-04-20 DIAGNOSIS — R7303 Prediabetes: Secondary | ICD-10-CM | POA: Diagnosis not present

## 2017-04-20 DIAGNOSIS — Z6835 Body mass index (BMI) 35.0-35.9, adult: Secondary | ICD-10-CM | POA: Diagnosis not present

## 2017-04-20 DIAGNOSIS — Z0000 Encounter for general adult medical examination without abnormal findings: Secondary | ICD-10-CM

## 2017-04-20 MED ORDER — NABUMETONE 500 MG PO TABS: 500.0000 mg | ORAL_TABLET | Freq: Two times a day (BID) | ORAL | 0 refills | Status: AC | PRN

## 2017-04-20 MED ORDER — METFORMIN HCL ER 500 MG PO TB24: 500.0000 mg | ORAL_TABLET | Freq: Every day | ORAL | 0 refills | Status: DC

## 2017-04-20 NOTE — Assessment & Plan Note (Signed)
Refer to bariatric surgery  

## 2017-04-20 NOTE — Assessment & Plan Note (Signed)
USPSTF grade A and B recommendations reviewed with patient; age-appropriate recommendations, preventive care, screening tests, etc discussed and encouraged; healthy living encouraged; see AVS for patient education given to patient  

## 2017-04-20 NOTE — Patient Instructions (Addendum)
We'll get the imaging of the left breast Try the nabumetone for headaches Hydrate really well to keep urine pale yellow or almost clear Return in 3 months for fasting labs and see me a couple of days after Change to the new metformin, the longer acting version and take one a day for one week, then increase to two a day We'll have you see the bariatric specialist Check out the information at familydoctor.org entitled "Nutrition for Weight Loss: What You Need to Know about Fad Diets" Try to lose between 1-2 pounds per week by taking in fewer calories and burning off more calories You can succeed by limiting portions, limiting foods dense in calories and fat, becoming more active, and drinking 8 glasses of water a day (64 ounces) Don't skip meals, especially breakfast, as skipping meals may alter your metabolism Do not use over-the-counter weight loss pills or gimmicks that claim rapid weight loss A healthy BMI (or body mass index) is between 18.5 and 24.9 You can calculate your ideal BMI at the [place] website ClubMonetize.fr  Health Maintenance, Female Adopting a healthy lifestyle and getting preventive care can go a long way to promote health and wellness. Talk with your health care provider about what schedule of regular examinations is right for you. This is a good chance for you to check in with your provider about disease prevention and staying healthy. In between checkups, there are plenty of things you can do on your own. Experts have done a lot of research about which lifestyle changes and preventive measures are most likely to keep you healthy. Ask your health care provider for more information. Weight and diet Eat a healthy diet  Be sure to include plenty of vegetables, fruits, low-fat dairy products, and lean protein.  Do not eat a lot of foods high in solid fats, added sugars, or salt.  Get regular exercise. This is one of the most  important things you can do for your health. ? Most adults should exercise for at least 150 minutes each week. The exercise should increase your heart rate and make you sweat (moderate-intensity exercise). ? Most adults should also do strengthening exercises at least twice a week. This is in addition to the moderate-intensity exercise.  Maintain a healthy weight  Body mass index (BMI) is a measurement that can be used to identify possible weight problems. It estimates body fat based on height and weight. Your health care provider can help determine your BMI and help you achieve or maintain a healthy weight.  For females 47 years of age and older: ? A BMI below 18.5 is considered underweight. ? A BMI of 18.5 to 24.9 is normal. ? A BMI of 25 to 29.9 is considered overweight. ? A BMI of 30 and above is considered obese.  Watch levels of cholesterol and blood lipids  You should start having your blood tested for lipids and cholesterol at 55 years of age, then have this test every 5 years.  You may need to have your cholesterol levels checked more often if: ? Your lipid or cholesterol levels are high. ? You are older than 55 years of age. ? You are at high risk for heart disease.  Cancer screening Lung Cancer  Lung cancer screening is recommended for adults 44-69 years old who are at high risk for lung cancer because of a history of smoking.  A yearly low-dose CT scan of the lungs is recommended for people who: ? Currently smoke. ? Have quit  within the past 15 years. ? Have at least a 30-pack-year history of smoking. A pack year is smoking an average of one pack of cigarettes a day for 1 year.  Yearly screening should continue until it has been 15 years since you quit.  Yearly screening should stop if you develop a health problem that would prevent you from having lung cancer treatment.  Breast Cancer  Practice breast self-awareness. This means understanding how your breasts  normally appear and feel.  It also means doing regular breast self-exams. Let your health care provider know about any changes, no matter how small.  If you are in your 20s or 30s, you should have a clinical breast exam (CBE) by a health care provider every 1-3 years as part of a regular health exam.  If you are 39 or older, have a CBE every year. Also consider having a breast X-ray (mammogram) every year.  If you have a family history of breast cancer, talk to your health care provider about genetic screening.  If you are at high risk for breast cancer, talk to your health care provider about having an MRI and a mammogram every year.  Breast cancer gene (BRCA) assessment is recommended for women who have family members with BRCA-related cancers. BRCA-related cancers include: ? Breast. ? Ovarian. ? Tubal. ? Peritoneal cancers.  Results of the assessment will determine the need for genetic counseling and BRCA1 and BRCA2 testing.  Cervical Cancer Your health care provider may recommend that you be screened regularly for cancer of the pelvic organs (ovaries, uterus, and vagina). This screening involves a pelvic examination, including checking for microscopic changes to the surface of your cervix (Pap test). You may be encouraged to have this screening done every 3 years, beginning at age 52.  For women ages 56-65, health care providers may recommend pelvic exams and Pap testing every 3 years, or they may recommend the Pap and pelvic exam, combined with testing for human papilloma virus (HPV), every 5 years. Some types of HPV increase your risk of cervical cancer. Testing for HPV may also be done on women of any age with unclear Pap test results.  Other health care providers may not recommend any screening for nonpregnant women who are considered low risk for pelvic cancer and who do not have symptoms. Ask your health care provider if a screening pelvic exam is right for you.  If you have had  past treatment for cervical cancer or a condition that could lead to cancer, you need Pap tests and screening for cancer for at least 20 years after your treatment. If Pap tests have been discontinued, your risk factors (such as having a new sexual partner) need to be reassessed to determine if screening should resume. Some women have medical problems that increase the chance of getting cervical cancer. In these cases, your health care provider may recommend more frequent screening and Pap tests.  Colorectal Cancer  This type of cancer can be detected and often prevented.  Routine colorectal cancer screening usually begins at 55 years of age and continues through 55 years of age.  Your health care provider may recommend screening at an earlier age if you have risk factors for colon cancer.  Your health care provider may also recommend using home test kits to check for hidden blood in the stool.  A small camera at the end of a tube can be used to examine your colon directly (sigmoidoscopy or colonoscopy). This is done to check  for the earliest forms of colorectal cancer.  Routine screening usually begins at age 54.  Direct examination of the colon should be repeated every 5-10 years through 55 years of age. However, you may need to be screened more often if early forms of precancerous polyps or small growths are found.  Skin Cancer  Check your skin from head to toe regularly.  Tell your health care provider about any new moles or changes in moles, especially if there is a change in a mole's shape or color.  Also tell your health care provider if you have a mole that is larger than the size of a pencil eraser.  Always use sunscreen. Apply sunscreen liberally and repeatedly throughout the day.  Protect yourself by wearing long sleeves, pants, a wide-brimmed hat, and sunglasses whenever you are outside.  Heart disease, diabetes, and high blood pressure  High blood pressure causes heart  disease and increases the risk of stroke. High blood pressure is more likely to develop in: ? People who have blood pressure in the high end of the normal range (130-139/85-89 mm Hg). ? People who are overweight or obese. ? People who are African American.  If you are 28-69 years of age, have your blood pressure checked every 3-5 years. If you are 29 years of age or older, have your blood pressure checked every year. You should have your blood pressure measured twice-once when you are at a hospital or clinic, and once when you are not at a hospital or clinic. Record the average of the two measurements. To check your blood pressure when you are not at a hospital or clinic, you can use: ? An automated blood pressure machine at a pharmacy. ? A home blood pressure monitor.  If you are between 26 years and 24 years old, ask your health care provider if you should take aspirin to prevent strokes.  Have regular diabetes screenings. This involves taking a blood sample to check your fasting blood sugar level. ? If you are at a normal weight and have a low risk for diabetes, have this test once every three years after 55 years of age. ? If you are overweight and have a high risk for diabetes, consider being tested at a younger age or more often. Preventing infection Hepatitis B  If you have a higher risk for hepatitis B, you should be screened for this virus. You are considered at high risk for hepatitis B if: ? You were born in a country where hepatitis B is common. Ask your health care provider which countries are considered high risk. ? Your parents were born in a high-risk country, and you have not been immunized against hepatitis B (hepatitis B vaccine). ? You have HIV or AIDS. ? You use needles to inject street drugs. ? You live with someone who has hepatitis B. ? You have had sex with someone who has hepatitis B. ? You get hemodialysis treatment. ? You take certain medicines for conditions,  including cancer, organ transplantation, and autoimmune conditions.  Hepatitis C  Blood testing is recommended for: ? Everyone born from [birth year] through 1964. ? Anyone with known risk factors for hepatitis C.  Sexually transmitted infections (STIs)  You should be screened for sexually transmitted infections (STIs) including gonorrhea and chlamydia if: ? You are sexually active and are younger than 55 years of age. ? You are older than 55 years of age and your health care provider tells you that you are at risk for  this type of infection. ? Your sexual activity has changed since you were last screened and you are at an increased risk for chlamydia or gonorrhea. Ask your health care provider if you are at risk.  If you do not have HIV, but are at risk, it may be recommended that you take a prescription medicine daily to prevent HIV infection. This is called pre-exposure prophylaxis (PrEP). You are considered at risk if: ? You are sexually active and do not regularly use condoms or know the HIV status of your partner(s). ? You take drugs by injection. ? You are sexually active with a partner who has HIV.  Talk with your health care provider about whether you are at high risk of being infected with HIV. If you choose to begin PrEP, you should first be tested for HIV. You should then be tested every 3 months for as long as you are taking PrEP. Pregnancy  If you are premenopausal and you may become pregnant, ask your health care provider about preconception counseling.  If you may become pregnant, take 400 to 800 micrograms (mcg) of folic acid every day.  If you want to prevent pregnancy, talk to your health care provider about birth control (contraception). Osteoporosis and menopause  Osteoporosis is a disease in which the bones lose minerals and strength with aging. This can result in serious bone fractures. Your risk for osteoporosis can be identified using a bone density scan.  If you are  66 years of age or older, or if you are at risk for osteoporosis and fractures, ask your health care provider if you should be screened.  Ask your health care provider whether you should take a calcium or vitamin D supplement to lower your risk for osteoporosis.  Menopause may have certain physical symptoms and risks.  Hormone replacement therapy may reduce some of these symptoms and risks. Talk to your health care provider about whether hormone replacement therapy is right for you. Follow these instructions at home:  Schedule regular health, dental, and eye exams.  Stay current with your immunizations.  Do not use any tobacco products including cigarettes, chewing tobacco, or electronic cigarettes.  If you are pregnant, do not drink alcohol.  If you are breastfeeding, limit how much and how often you drink alcohol.  Limit alcohol intake to no more than 1 drink per day for nonpregnant women. One drink equals 12 ounces of beer, 5 ounces of wine, or 1 ounces of hard liquor.  Do not use street drugs.  Do not share needles.  Ask your health care provider for help if you need support or information about quitting drugs.  Tell your health care provider if you often feel depressed.  Tell your health care provider if you have ever been abused or do not feel safe at home. This information is not intended to replace advice given to you by your health care provider. Make sure you discuss any questions you have with your health care provider. Document Released: 01/05/2011 Document Revised: 11/28/2015 Document Reviewed: 03/26/2015 Elsevier Interactive Patient Education  2018 [employer].  Obesity, Adult Obesity is having too much body fat. If you have a BMI of 30 or more, you are obese. BMI is a number that explains how much body fat you have. Obesity is often caused by taking in (consuming) more calories than your body uses. Obesity can cause serious health problems. Changing your  lifestyle can help to treat obesity. Follow these instructions at home: Eating and drinking  Follow advice from your doctor about what to eat and drink. Your doctor may tell you to: ? Cut down on (limit) fast foods, sweets, and processed snack foods. ? Choose low-fat options. For example, choose low-fat milk instead of whole milk. ? Eat 5 or more servings of fruits or vegetables every day. ? Eat at home more often. This gives you more control over what you eat. ? Choose healthy foods when you eat out. ? Learn what a healthy portion size is. A portion size is the amount of a certain food that is healthy for you to eat at one time. This is different for each person. ? Keep low-fat snacks available. ? Avoid sugary drinks. These include soda, fruit juice, iced tea that is sweetened with sugar, and flavored milk. ? Eat a healthy breakfast.  Drink enough water to keep your pee (urine) clear or pale yellow.  Do not go without eating for long periods of time (do not fast).  Do not go on popular or trendy diets (fad diets). Physical Activity  Exercise often, as told by your doctor. Ask your doctor: ? What types of exercise are safe for you. ? How often you should exercise.  Warm up and stretch before being active.  Do slow stretching after being active (cool down).  Rest between times of being active. Lifestyle  Limit how much time you spend in front of your TV, computer, or video game system (be less sedentary).  Find ways to reward yourself that do not involve food.  Limit alcohol intake to no more than 1 drink a day for nonpregnant women and 2 drinks a day for men. One drink equals 12 oz of beer, 5 oz of wine, or 1 oz of hard liquor. General instructions  Keep a weight loss journal. This can help you keep track of: ? The food that you eat. ? The exercise that you do.  Take over-the-counter and prescription medicines only as told by your doctor.  Take vitamins and  supplements only as told by your doctor.  Think about joining a support group. Your doctor may be able to help with this.  Keep all follow-up visits as told by your doctor. This is important. Contact a doctor if:  You cannot meet your weight loss goal after you have changed your diet and lifestyle for 6 weeks. This information is not intended to replace advice given to you by your health care provider. Make sure you discuss any questions you have with your health care provider. Document Released: 09/14/2011 Document Revised: 11/28/2015 Document Reviewed: 04/10/2015 Elsevier Interactive Patient Education  2018 [employer].

## 2017-04-20 NOTE — Progress Notes (Signed)
Patient ID: Yvette Garza, female   DOB: 1960-10-11, 56 y.o.   MRN: [id number]   Subjective:   Yvette Garza is a 56 y.o. female here for a complete physical exam  Interim issues since last visit: having some headaches, nothing new  USPSTF grade A and B recommendations Depression:  Depression screen [facility] 2/9 04/20/2017 01/22/2017 09/08/2016 03/10/2016 01/31/2016  Decreased Interest 0 0 0 1 0  Down, Depressed, Hopeless 0 0 0 1 1  PHQ - 2 Score 0 0 0 2 1  Altered sleeping - - - 1 -  Tired, decreased energy - - - 1 -  Change in appetite - - - 1 -  Feeling bad or failure about yourself  - - - 0 -  Trouble concentrating - - - 1 -  Moving slowly or fidgety/restless - - - 0 -  Suicidal thoughts - - - 0 -  PHQ-9 Score - - - 6 -  Difficult doing work/chores - - - - -   Hypertension: BP during the screening was fine BP Readings from Last 3 Encounters:  04/20/17 130/78  01/22/17 118/76  09/08/16 134/76   Obesity: she has cut out fried foods; she has cut out sodas She tried to eat healthier, more chicken instead of steak; including more vegetables Trying to get more walking; does yard work and cleans up stuff around the house to keep her moving Suffers from depression and has fibromyalgia She would entertain medicine for weight loss  Wt Readings from Last 3 Encounters:  04/20/17 216 lb 14.4 oz (98.4 kg)  01/22/17 217 lb 6.4 oz (98.6 kg)  09/08/16 208 lb 8 oz (94.6 kg)   BMI Readings from Last 3 Encounters:  04/20/17 38.42 kg/m  01/22/17 38.51 kg/m  09/08/16 36.93 kg/m    Alcohol: very rarely Tobacco use: nonsmoker HIV, hep B, hep C: already done STD testing and prevention (chl/gon/syphilis): not interested Intimate partner violence: no abuse Breast cancer: no lumps, last mammo was 6-8 months ago BRCA gene screening: no breast or ovarian cancer Cervical cancer screening: hysterectomy and cervix gone too; ovaries remain; heavy bleeding, no cancer; on occasional she feels something  on the left side; sometimes feels bloated Osteoporosis: n/a Fall prevention/vitamin D: gets enough sun, 800 iu during the winter months Lipids:  Lab Results  Component Value Date   CHOL CANCELED 02/18/2016   CHOL 201 (H) 02/06/2015   Lab Results  Component Value Date   HDL 39 02/19/2017   HDL CANCELED 02/18/2016   HDL 42 02/06/2015   Lab Results  Component Value Date   LDLCALC 130 02/19/2017   LDLCALC NOT CALC 02/18/2016   LDLCALC 115 (H) 02/06/2015   Lab Results  Component Value Date   TRIG 276 (A) 02/19/2017   TRIG CANCELED 02/18/2016   TRIG 218 (H) 02/06/2015   Lab Results  Component Value Date   CHOLHDL CANCELED 02/18/2016   CHOLHDL 4.8 (H) 02/06/2015   No results found for: LDLDIRECT Glucose:  Glucose  Date Value Ref Range Status  02/26/2016 100 (H) 65 - 99 mg/dL Final  02/06/2015 96 65 - 99 mg/dL Final   Glucose, Bld  Date Value Ref Range Status  02/18/2016 CANCELED 65 - 99 mg/dL     Comment:    Result canceled by the ancillary   Colorectal cancer: UTD, due 2021; no fam hx Lung cancer:  nonsmoker AAA: n/a Aspirin: n/a Diet: discussed Exercise: discussed Skin cancer: nothing worrisome  Past Medical History:  Diagnosis Date  .  Allergy   . Anemia    resolved after hysterectomy  . Anxiety   . Arthritis   . Chronic fatigue syndrome   . Depression   . Fibromyalgia   . Heart murmur   . History of shingles   . Hypercholesterolemia 01/31/2016  . Hypertension goal BP (blood pressure) < 140/90 01/17/2015   Previously on ACEI but now weaned off.   . Neuropathy    both arms and hands  . Post traumatic stress disorder (PTSD)   . Prediabetes 01/31/2016  . Restless leg syndrome    Past Surgical History:  Procedure Laterality Date  . ABDOMINAL HYSTERECTOMY  2014  . APPENDECTOMY     age 23  . BREAST BIOPSY Right 09/2015   benign  . [place] SURGERY  2014  . CESAREAN SECTION     3  . COLONOSCOPY WITH PROPOFOL N/A 03/14/2015   Procedure:  COLONOSCOPY WITH PROPOFOL;  Surgeon: Lucilla Lame, MD;  Location: [place];  Service: Endoscopy;  Laterality: N/A;  . DILATION AND CURETTAGE OF UTERUS    . POLYPECTOMY  03/14/2015   Procedure: POLYPECTOMY;  Surgeon: Lucilla Lame, MD;  Location: [place];  Service: Endoscopy;;  sigmoid colon polyp, transverse colon polyp  . ROTATOR CUFF REPAIR Right    bone shaved also   Family History  Problem Relation Age of Onset  . Hypertension Mother   . Asthma Sister   . Emphysema Sister   . Arthritis Maternal Grandmother   . Diabetes Maternal Grandmother   . Cataracts Maternal Grandmother   . Heart disease Maternal Grandmother        aortic tear  . Stroke Maternal Grandmother   . Diabetes Maternal Aunt   . Hyperlipidemia Maternal Aunt   . Breast cancer Neg Hx    Social History  Substance Use Topics  . Smoking status: Never Smoker  . Smokeless tobacco: Never Used  . Alcohol use 0.0 oz/week     Comment: socially   Review of Systems  Objective:   Vitals:   04/20/17 1407  BP: 130/78  Pulse: 90  Temp: 98 F (36.7 C)  TempSrc: Oral  SpO2: 98%  Weight: 216 lb 14.4 oz (98.4 kg)  Height: 5' 3"  (1.6 m)   Body mass index is 38.42 kg/m. Wt Readings from Last 3 Encounters:  04/20/17 216 lb 14.4 oz (98.4 kg)  01/22/17 217 lb 6.4 oz (98.6 kg)  09/08/16 208 lb 8 oz (94.6 kg)   Physical Exam  Constitutional: She appears well-developed and well-nourished.  obese  HENT:  Head: Normocephalic and atraumatic.  Right Ear: Hearing, tympanic membrane, external ear and ear canal normal.  Left Ear: Hearing, tympanic membrane, external ear and ear canal normal.  Eyes: Conjunctivae and EOM are normal. Right eye exhibits no hordeolum. Left eye exhibits no hordeolum. No scleral icterus.  Neck: Carotid bruit is not present. No thyromegaly present.  Cardiovascular: Normal rate, regular rhythm, S1 normal, S2 normal and normal heart sounds.   No extrasystoles are present.    Pulmonary/Chest: Effort normal and breath sounds normal. No respiratory distress. Right breast exhibits no inverted nipple, no mass, no nipple discharge, no skin change and no tenderness. Left breast exhibits mass. Left breast exhibits no inverted nipple, no nipple discharge, no skin change and no tenderness. Breasts are symmetrical.    2 and 5 o'clock LEFT breast  Abdominal: Soft. Normal appearance and bowel sounds are normal. She exhibits no distension, no abdominal bruit, no pulsatile midline mass  and no mass. There is no hepatosplenomegaly. There is no tenderness. No hernia.  Musculoskeletal: Normal range of motion. She exhibits no edema.  Lymphadenopathy:       Head (right side): No submandibular adenopathy present.       Head (left side): No submandibular adenopathy present.    She has no cervical adenopathy.    She has no axillary adenopathy.  Neurological: She is alert. She displays no tremor. No cranial nerve deficit. She exhibits normal muscle tone. Gait normal.  Reflex Scores:      Patellar reflexes are 2+ on the right side and 2+ on the left side. Skin: Skin is warm and dry. No bruising and no ecchymosis noted. No cyanosis. No pallor.  Psychiatric: Her speech is normal and behavior is normal. Thought content normal. Her mood appears not anxious. She does not exhibit a depressed mood.    Assessment/Plan:   Problem List Items Addressed This Visit      Other   Preventative health care - Primary    USPSTF grade A and B recommendations reviewed with patient; age-appropriate recommendations, preventive care, screening tests, etc discussed and encouraged; healthy living encouraged; see AVS for patient education given to patient      Relevant Orders   CBC with Differential/Platelet   COMPLETE METABOLIC PANEL WITH GFR   Lipid panel   TSH   Prediabetes    Start back metformin but we'll do the long-acting kind      Relevant Orders   Hemoglobin A1c   Obesity (BMI 35.0-39.9  without comorbidity)    Refer to bariatric surgery      Relevant Medications   metFORMIN (GLUCOPHAGE XR) 500 MG 24 hr tablet   Other Relevant Orders   Amb Referral to Bariatric Surgery    Other Visit Diagnoses    Pelvic pain       discomfort, LEFT side   Relevant Orders   US Pelvis Complete   US Transvaginal Non-OB   Breast mass, left       Relevant Orders   MM Digital Diagnostic Unilat L   US BREAST LTD UNI LEFT INC AXILLA       Meds ordered this encounter  Medications  . LORazepam (ATIVAN) 1 MG tablet    Sig: Take 1 pills 60 minutes prior to MRI and second dose 15 minutes prior to MRI if needed.  Marland Kitchen DISCONTD: naproxen (NAPROSYN) 500 MG tablet    Sig: Take by mouth.  . metFORMIN (GLUCOPHAGE XR) 500 MG 24 hr tablet    Sig: Take 1 tablet (500 mg total) by mouth daily. For 1 week, then two a day    Dispense:  53 tablet    Refill:  0  . nabumetone (RELAFEN) 500 MG tablet    Sig: Take 1 tablet (500 mg total) by mouth 2 (two) times daily as needed. Take with food    Dispense:  30 tablet    Refill:  0   Orders Placed This Encounter  Procedures  . US Pelvis Complete    Standing Status:   Future    Standing Expiration Date:   06/20/2018    Order Specific Question:   Reason for Exam (SYMPTOM  OR DIAGNOSIS REQUIRED)    Answer:   left sided cramp, discomfort; r/o ovarian cyst, malignancy    Order Specific Question:   Preferred imaging location?    Answer:   [facility]  . US Transvaginal Non-OB    Standing Status:   Future  Standing Expiration Date:   06/20/2018    Order Specific Question:   Reason for Exam (SYMPTOM  OR DIAGNOSIS REQUIRED)    Answer:   left sided cramp, discomfort; r/o ovarian cyst, malignancy    Order Specific Question:   Preferred imaging location?    Answer:   [facility]  . MM Digital Diagnostic Unilat L    Standing Status:   Future    Standing Expiration Date:   06/20/2018    Order Specific Question:   Reason for Exam (SYMPTOM   OR DIAGNOSIS REQUIRED)    Answer:   fullness at 2 and 5 o'clock position LEFT breast    Order Specific Question:   Is the patient pregnant?    Answer:   No    Order Specific Question:   Preferred imaging location?    Answer:   [facility]  . US BREAST LTD UNI LEFT INC AXILLA    Standing Status:   Future    Standing Expiration Date:   06/21/2017    Order Specific Question:   Reason for Exam (SYMPTOM  OR DIAGNOSIS REQUIRED)    Answer:   fullness at 2 and 5 o'clock position LEFT breast    Order Specific Question:   Preferred imaging location?    Answer:   [place] metabolic panel    This external order was created through the Results Console.  . Lipid panel    This external order was created through the Results Console.  . Hemoglobin A1c    This external order was created through the Results Console.  Marland Kitchen CBC with Differential/Platelet    Standing Status:   Future    Standing Expiration Date:   10/04/2017  . COMPLETE METABOLIC PANEL WITH GFR    Standing Status:   Future    Standing Expiration Date:   10/04/2017  . Hemoglobin A1c    Standing Status:   Future    Standing Expiration Date:   10/04/2017  . Lipid panel    Standing Status:   Future    Standing Expiration Date:   10/04/2017  . TSH    Standing Status:   Future    Standing Expiration Date:   10/04/2017  . Amb Referral to Bariatric Surgery    Referral Priority:   Routine    Referral Type:   Consultation    Number of Visits Requested:   1    Follow up plan: Return in about 3 months (around 07/27/2017) for follow-up visit with Dr. Sanda Klein; fasting labs prior to visit.  An After Visit Summary was printed and given to the patient.

## 2017-04-20 NOTE — Assessment & Plan Note (Signed)
Start back metformin but we'll do the long-acting kind

## 2017-04-30 ENCOUNTER — Ambulatory Visit: Payer: 59

## 2017-05-02 ENCOUNTER — Other Ambulatory Visit: Payer: Self-pay | Admitting: Family Medicine

## 2017-05-03 NOTE — Telephone Encounter (Signed)
This is too much nabumetone; request received and it's not even been 15 days She could be developing rebound headaches from excessive NSAID use Please offer referral to neurologist STOP the nabumetone

## 2017-05-17 ENCOUNTER — Other Ambulatory Visit: Payer: Self-pay | Admitting: Family Medicine

## 2017-07-06 ENCOUNTER — Other Ambulatory Visit: Payer: Self-pay | Admitting: Family Medicine

## 2017-07-06 DIAGNOSIS — F418 Other specified anxiety disorders: Secondary | ICD-10-CM

## 2017-07-07 NOTE — Telephone Encounter (Signed)
Please ask pt to have the labs done that were ordered in October Please ask her to schedule her f/u appointment (3 months after last visit, so coming due) Ask her to get the breast imaging ordered at last visit; re-order if needed / expired Thank you

## 2017-07-07 NOTE — Telephone Encounter (Signed)
Called pt no answer. LM for pt informing her of message per Dr.Lada.

## 2017-08-20 ENCOUNTER — Other Ambulatory Visit: Payer: Self-pay | Admitting: Family Medicine

## 2017-08-20 DIAGNOSIS — N6489 Other specified disorders of breast: Secondary | ICD-10-CM

## 2017-08-22 NOTE — Progress Notes (Signed)
Closing out lab/order note open since:  2017

## 2017-08-22 NOTE — Progress Notes (Signed)
Closing out lab/order note open since:  2018 

## 2017-08-24 NOTE — Progress Notes (Signed)
Closing out scanned document note open since  June 2018

## 2017-09-09 ENCOUNTER — Telehealth: Payer: Self-pay | Admitting: Family Medicine

## 2017-09-09 NOTE — Telephone Encounter (Signed)
Breast imaging ordered by another provider; already scheduled

## 2017-09-09 NOTE — Telephone Encounter (Signed)
-----   Message from Arnetha Courser, MD sent at 09/21/2016 12:56 PM EDT ----- Regarding: f/u breast imaging due March 2019 [place] states one year for: Bilateral diagnostic mammogram and possible right breast ultrasound

## 2017-09-21 ENCOUNTER — Other Ambulatory Visit: Payer: Self-pay | Admitting: Family Medicine

## 2017-09-21 ENCOUNTER — Ambulatory Visit
Admission: RE | Admit: 2017-09-21 | Discharge: 2017-09-21 | Disposition: A | Discharge status: Home / Self Care | Payer: BLUE CROSS/BLUE SHIELD | Source: Ambulatory Visit | Attending: Family Medicine | Admitting: Family Medicine

## 2017-09-21 DIAGNOSIS — Z1321 Encounter for screening for nutritional disorder: Secondary | ICD-10-CM | POA: Insufficient documentation

## 2017-09-21 DIAGNOSIS — N6489 Other specified disorders of breast: Secondary | ICD-10-CM

## 2017-09-21 DIAGNOSIS — R928 Other abnormal and inconclusive findings on diagnostic imaging of breast: Secondary | ICD-10-CM

## 2017-09-27 ENCOUNTER — Other Ambulatory Visit: Payer: Self-pay | Admitting: Family Medicine

## 2017-09-27 DIAGNOSIS — R928 Other abnormal and inconclusive findings on diagnostic imaging of breast: Secondary | ICD-10-CM

## 2017-10-07 ENCOUNTER — Ambulatory Visit
Admission: RE | Admit: 2017-10-07 | Discharge: 2017-10-07 | Disposition: A | Discharge status: Home / Self Care | Payer: BLUE CROSS/BLUE SHIELD | Source: Ambulatory Visit | Attending: Family Medicine | Admitting: Family Medicine

## 2017-10-07 DIAGNOSIS — R928 Other abnormal and inconclusive findings on diagnostic imaging of breast: Secondary | ICD-10-CM

## 2017-10-07 DIAGNOSIS — N6321 Unspecified lump in the left breast, upper outer quadrant: Secondary | ICD-10-CM | POA: Diagnosis not present

## 2017-10-07 DIAGNOSIS — N6489 Other specified disorders of breast: Secondary | ICD-10-CM | POA: Diagnosis not present

## 2017-10-07 DIAGNOSIS — N6320 Unspecified lump in the left breast, unspecified quadrant: Secondary | ICD-10-CM | POA: Diagnosis present

## 2017-10-07 HISTORY — PX: BREAST BIOPSY: SHX20

## 2017-10-08 LAB — SURGICAL PATHOLOGY

## 2017-10-25 ENCOUNTER — Other Ambulatory Visit: Payer: Self-pay | Admitting: Family Medicine

## 2018-04-20 ENCOUNTER — Other Ambulatory Visit: Payer: Self-pay | Admitting: Internal Medicine

## 2018-04-28 ENCOUNTER — Other Ambulatory Visit: Payer: Self-pay | Admitting: Internal Medicine

## 2018-04-28 DIAGNOSIS — N6489 Other specified disorders of breast: Secondary | ICD-10-CM

## 2018-05-12 ENCOUNTER — Ambulatory Visit
Admission: RE | Admit: 2018-05-12 | Discharge: 2018-05-12 | Disposition: A | Discharge status: Home / Self Care | Payer: Managed Care, Other (non HMO) | Source: Ambulatory Visit | Attending: Internal Medicine | Admitting: Internal Medicine

## 2018-05-12 DIAGNOSIS — N6489 Other specified disorders of breast: Secondary | ICD-10-CM | POA: Diagnosis present

## 2018-05-15 ENCOUNTER — Other Ambulatory Visit: Payer: Self-pay | Admitting: Family Medicine

## 2018-05-15 NOTE — Telephone Encounter (Signed)
Nasal spray requested; refill approved She has not been seen in over a year Please have her schedule an appt Thank you

## 2018-05-16 NOTE — Telephone Encounter (Signed)
Called [phone] @ 11:11 left voice message informing pt that prescription has been called in and asked pt to schedule appt with Dr Sanda Klein. Pt does have option of seeing Benjamine Mola

## 2018-07-16 ENCOUNTER — Other Ambulatory Visit: Payer: Self-pay | Admitting: Family Medicine

## 2018-07-16 DIAGNOSIS — F418 Other specified anxiety disorders: Secondary | ICD-10-CM

## 2019-08-06 IMAGING — [facility] DIGITAL DIAGNOSTIC BILATERAL MAMMOGRAM WITH TOMO AND CAD
8 of 23 series · 8 of 40 positions shown · non-contrast
Comparison: Mammography 09/18/2016 (bilateral), 04/03/2016 (right),
10/01/2015 (right), 09/19/2015 (right), 09/17/2015 (bilateral).

CLINICAL DATA: Two year interval follow-up of a likely benign focal
asymmetry involving the upper inner quadrant of the right breast
without sonographic correlate. Prior benign core needle biopsy of a
mass in the lower outer quadrant of the right breast at anterior
depth.Annual evaluation, left breast.

EXAM:
DIGITAL DIAGNOSTIC BILATERAL MAMMOGRAM WITH CAD AND TOMO
ULTRASOUND LEFT BREAST

[L MLO]
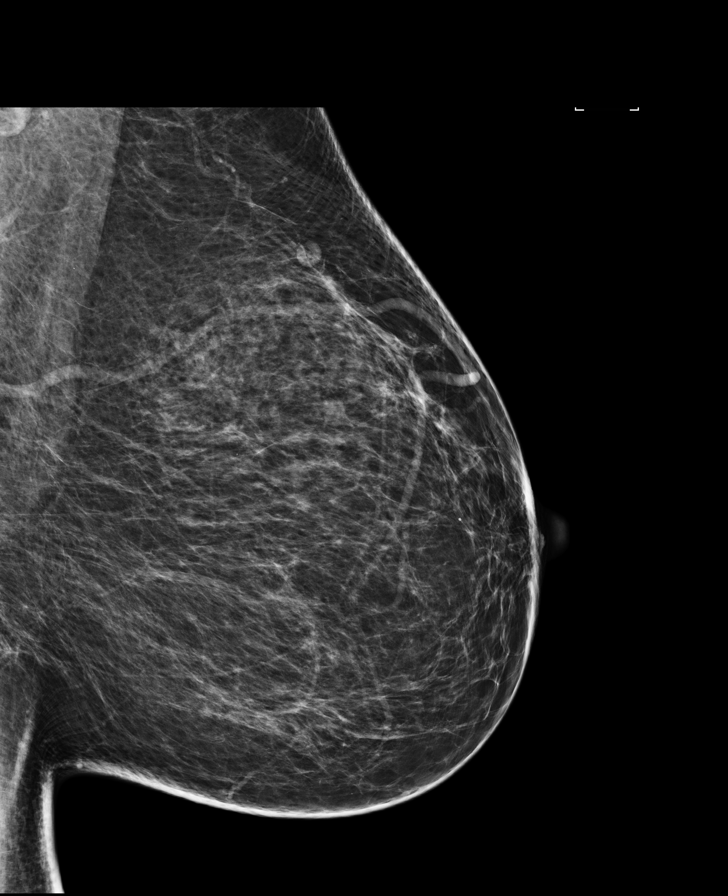

[R CC]
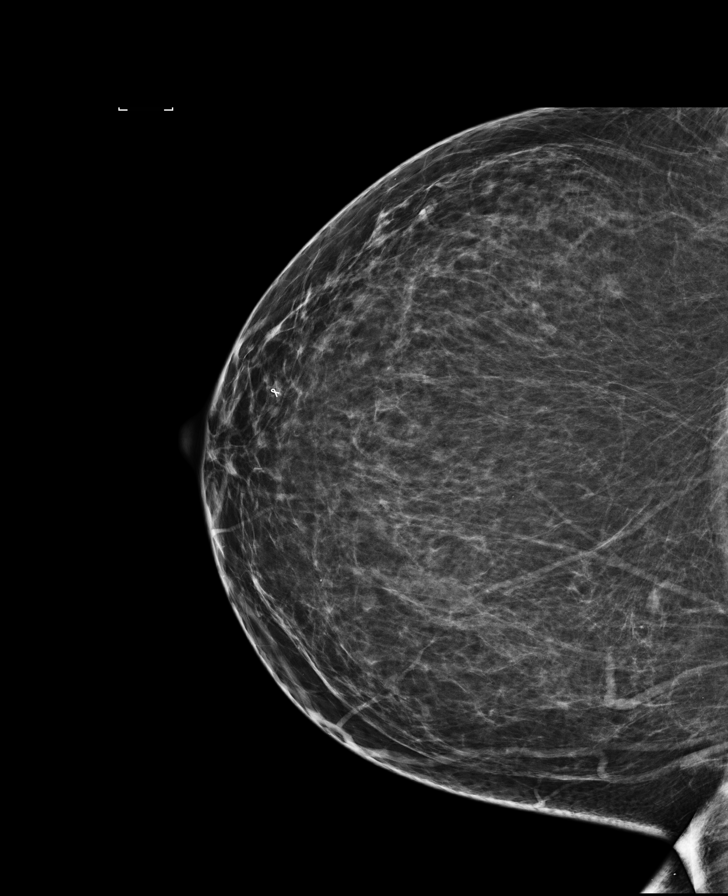

[L CC synth-2D (1 of 2)]
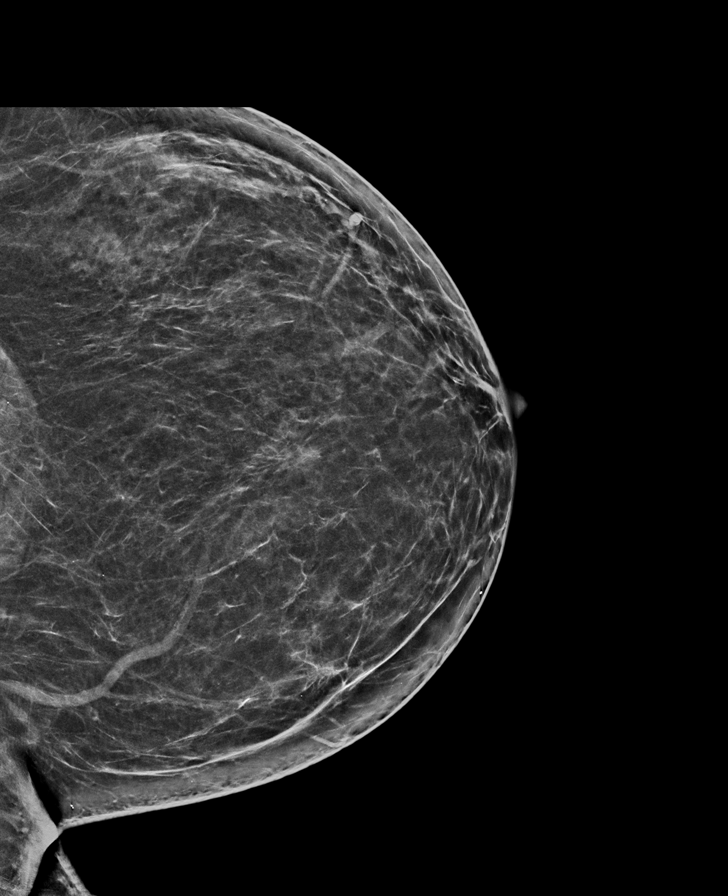

[L MLO synth-2D]
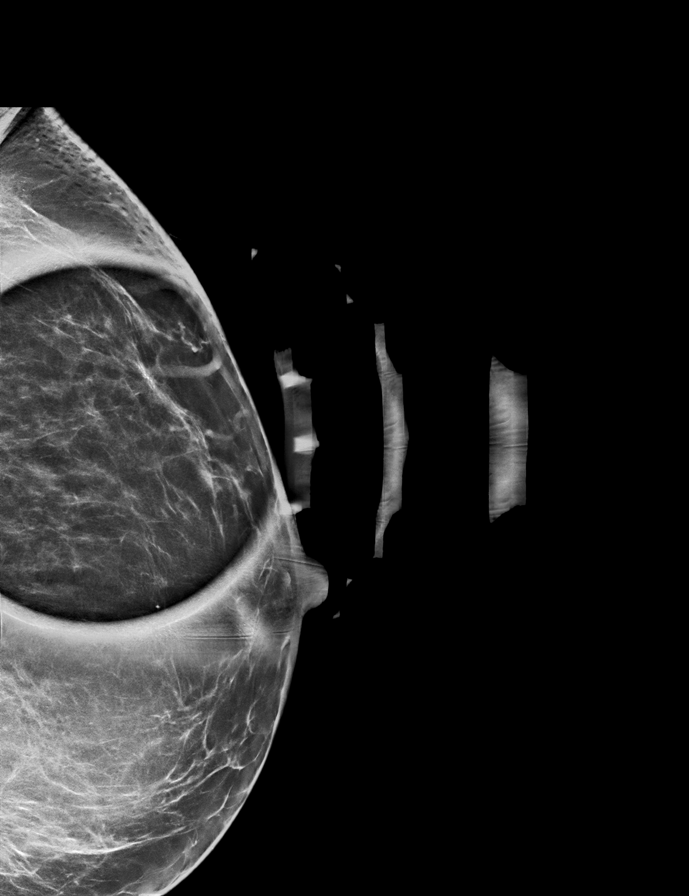

[L ML]
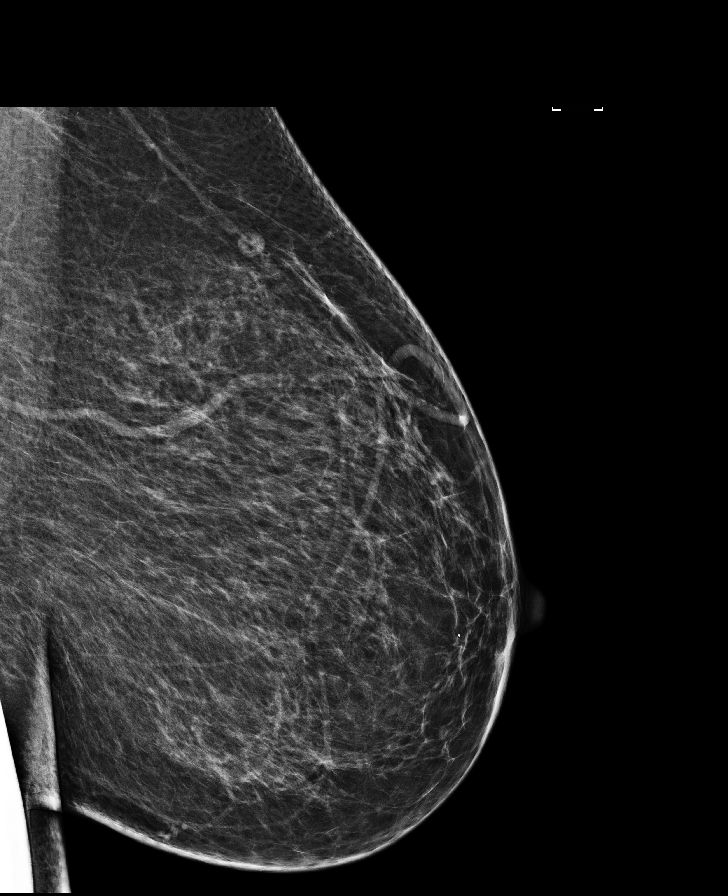

[L CC synth-2D (2 of 2)]
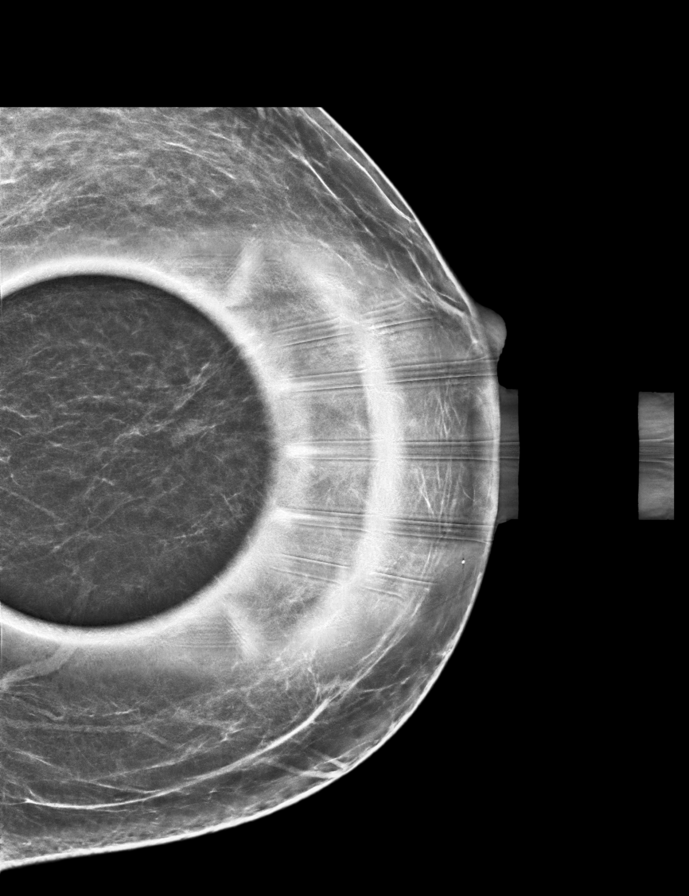

[L CC]
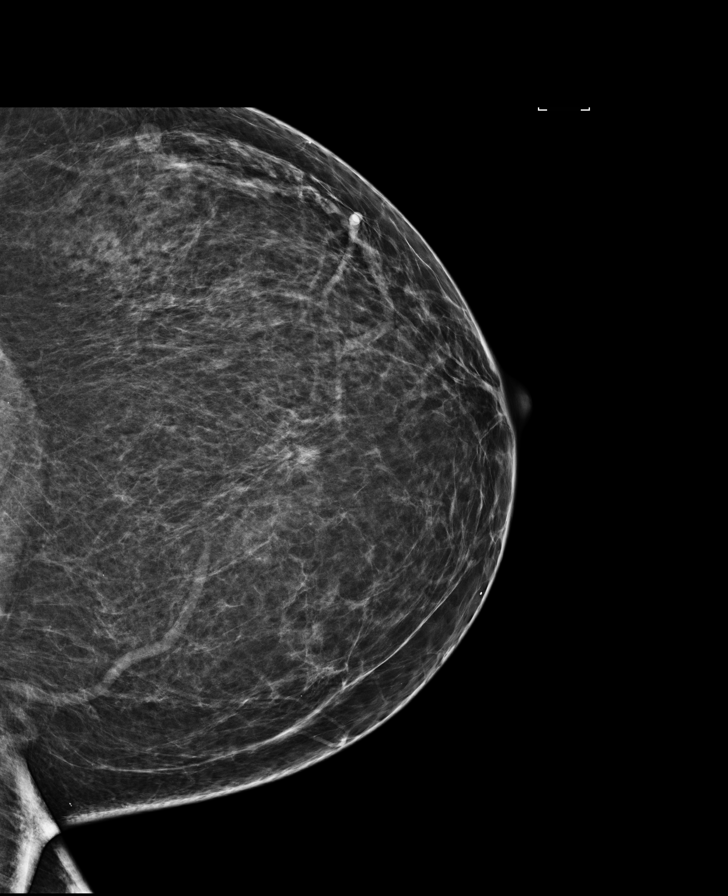

[L MLO tomo · tomo slice 45/66.0]
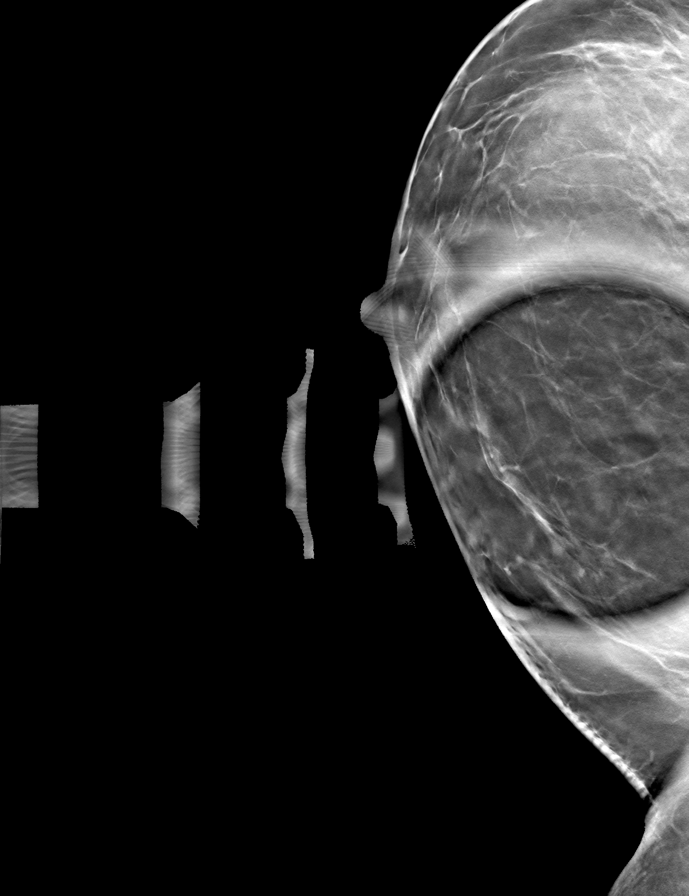

[8 of 40 positions shown; findings below may reference images not displayed]

No
prior left breast ultrasound.

ACR Breast Density Category b: There are scattered areas of
fibroglandular density.
FINDINGS: Standard 2D and tomosynthesis full field CC and MLO views of both
breasts were obtained. Standard and tomosynthesis full field
mediolateral view of the left breast and standard and tomosynthesis
spot-compression CC and MLO views of a mammographic finding in the
left breast were also obtained.

The focal asymmetry in the upper inner quadrant of the right breast
is less conspicuous on today's examination. No new or suspicious
findings in the right breast.

A possible asymmetry associated with vague architectural distortion
in the retroareolar left breast at middle depth, reliably visualized
only on the initial full field CC tomosynthesis images, persists on
the spot-compression tomosynthesis images. On the spot compression
MLO and full field mediolateral images, this localizes to the upper
breast, but is better seen on the CC images. On the full field
mediolateral images, a second area of architectural distortion is
present in the upper breast at posterior depth. This localizes to
the upper outer quadrant on the CC images, though the distortion
posteriorly is better seen on the mediolateral image.

Mammographic images were processed with CAD.

On physical exam, there is no palpable abnormality in the upper left
breast or in the upper outer quadrant of the left breast.

Targeted left breast ultrasound is performed, showing normal
scattered fibroglandular tissue in the upper breast at middle depth
and in the upper outer quadrant at posterior depth. No cyst, solid
mass, abnormal acoustic shadowing or architectural distortion is
identified.
IMPRESSION: 1. Two areas of architectural distortion involving the left breast,
one in the upper breast at middle depth and the other in the upper
outer quadrant at posterior depth. Neither of the areas of
architectural distortion have a correlate on ultrasound.
2. Stable benign focal asymmetry involving the upper inner quadrant
of the right breast dating back to September 2015, likely therefore an
island of glandular tissue.
3. No mammographic evidence of malignancy involving the right
breast.

RECOMMENDATION:
Stereotactic tomosynthesis core needle biopsy of the 2 areas of
architectural distortion in the left breast. (Please note that the
architectural distortion in the upper breast at middle depth is
better seen on the CC images and the architectural distortion in the
upper outer breast at posterior depth is better seen on the
mediolateral images).

The stereotactic tomosynthesis biopsy procedure will be scheduled
for the patient.

I have discussed the findings and recommendations with the patient.
Results were also provided in writing at the conclusion of the
visit.

BI-RADS CATEGORY  4: Suspicious.

## 2019-11-24 ENCOUNTER — Other Ambulatory Visit: Payer: Self-pay | Admitting: Internal Medicine

## 2019-11-24 DIAGNOSIS — F418 Other specified anxiety disorders: Secondary | ICD-10-CM

## 2019-11-28 ENCOUNTER — Other Ambulatory Visit: Payer: Self-pay | Admitting: Internal Medicine

## 2019-11-28 DIAGNOSIS — F418 Other specified anxiety disorders: Secondary | ICD-10-CM
